# Patient Record
Sex: Female | Born: 1990 | Race: White | Hispanic: No | Marital: Single | Smoking: Former smoker
Health system: Southern US, Community
[De-identification: ages and names within clinical notes are randomized; demographics above are authoritative.]

## PROBLEM LIST (undated history)

## (undated) DIAGNOSIS — F32A Depression, unspecified: Secondary | ICD-10-CM

## (undated) DIAGNOSIS — R319 Hematuria, unspecified: Secondary | ICD-10-CM

## (undated) DIAGNOSIS — F909 Attention-deficit hyperactivity disorder, unspecified type: Secondary | ICD-10-CM

## (undated) DIAGNOSIS — N289 Disorder of kidney and ureter, unspecified: Secondary | ICD-10-CM

## (undated) DIAGNOSIS — F329 Major depressive disorder, single episode, unspecified: Secondary | ICD-10-CM

## (undated) DIAGNOSIS — Z87442 Personal history of urinary calculi: Secondary | ICD-10-CM

## (undated) DIAGNOSIS — N201 Calculus of ureter: Secondary | ICD-10-CM

## (undated) DIAGNOSIS — R350 Frequency of micturition: Secondary | ICD-10-CM

## (undated) DIAGNOSIS — F419 Anxiety disorder, unspecified: Secondary | ICD-10-CM

## (undated) HISTORY — PX: RHINOPLASTY: SUR1284

## (undated) HISTORY — PX: WISDOM TOOTH EXTRACTION: SHX21

---
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
## 2011-03-27 LAB — POCT URINALYSIS DIP (DEVICE)
Bilirubin Urine: NEGATIVE
Glucose, UA: NEGATIVE
Ketones, ur: 40 — AB
Nitrite: NEGATIVE
Operator id: 239701
Protein, ur: 30 — AB
Specific Gravity, Urine: 1.01
Urobilinogen, UA: 0.2
pH: 6

## 2011-03-27 LAB — POCT PREGNANCY, URINE
Operator id: 239701
Preg Test, Ur: NEGATIVE

## 2013-03-14 ENCOUNTER — Encounter (HOSPITAL_BASED_OUTPATIENT_CLINIC_OR_DEPARTMENT_OTHER): Payer: Self-pay | Admitting: *Deleted

## 2013-03-14 ENCOUNTER — Other Ambulatory Visit: Payer: Self-pay | Admitting: Urology

## 2013-03-14 NOTE — Progress Notes (Addendum)
NPO AFTER MN. ARRIVES AT 1100. NEEDS HG, KUB AND  URINE PREG. MAY TAKE OXYCODONE/ PHENERGAN IF NEEDED W/ SIPS OF WATER.

## 2013-03-16 DIAGNOSIS — N201 Calculus of ureter: Secondary | ICD-10-CM | POA: Diagnosis present

## 2013-03-16 NOTE — H&P (Signed)
Reason For Visit  Emily Silva is a 22 year old female who was seen as an emergent work in having experienced right flank pain and gross hematuria.   History of Present Illness  Nephrolithiasis: She has positive multiple stones and has never required any form of surgical intervention. Stone analysis: Calcium oxalate.  Current history: She said she's passed multiple stones over the years but recently, starting on 03/05/13 developed gross hematuria. It was a greater amount of hematuria and she has had with previous stone passage. It was associated with some pain located in the right flank region which is what she experiences typically with her stones. She has been having intermittent gross hematuria and now has been experiencing pain in the lower abdomen and pelvic region that she describes as sharp in nature and intermittent. It's not relieved by positional change.   Past Medical History Problems  1. History of  Anxiety (Symptom) 300.00 2. History of  Nephrolithiasis V13.01  Surgical History Problems  1. History of  Oral Surgery Tooth Extraction  Current Meds 1. Adderall 15 MG Oral Tablet; Therapy: (Recorded:11Sep2014) to 2. Ciprofloxacin HCl 500 MG Oral Tablet; Therapy: (Recorded:11Sep2014) to 3. Flomax 0.4 MG Oral Capsule; Therapy: (Recorded:11Sep2014) to 4. Hydrocodone-Acetaminophen 5-325 MG Oral Tablet; Therapy: (Recorded:11Sep2014) to 5. Phenergan 25 MG TABS; Therapy: (Recorded:11Sep2014) to 6. Xanax 1 MG Oral Tablet; TAKE TABLET  PRN; Therapy: (Recorded:11Sep2014) to  Allergies Medication  1. No Known Drug Allergies  Family History Problems  1. Family history of  Family Health Status Of Father - Alive 2. Family history of  Family Health Status Of Mother - Alive 3. Paternal history of  Hematuria 4. Family history of  Nephrolithiasis 5. Paternal uncle's history of  Prostate Cancer V16.42  Social History Problems    Alcohol Use 1-2 per month   Caffeine Use 2   Currently In  School   Marital History - Single   Tobacco Use 305.1 smokes 1 pack occasionally/socailly for 1 year  Review of Systems Genitourinary, constitutional, skin, eye, otolaryngeal, hematologic/lymphatic, cardiovascular, pulmonary, endocrine, musculoskeletal, gastrointestinal, neurological and psychiatric system(s) were reviewed and pertinent findings if present are noted.  Genitourinary: urinary frequency, urinary urgency, dysuria, nocturia, urinary hesitancy, urinary stream starts and stops, hematuria and initiating urination requires straining.  Gastrointestinal: nausea and vomiting.  Constitutional: night sweats and feeling tired (fatigue).  ENT: sinus problems.  Musculoskeletal: back pain.    Vitals Vital Signs  BMI Calculated: 26.04 BSA Calculated: 2.04 Height: 5 ft 11 in Weight: 186 lb  Blood Pressure: 133 / 90 Temperature: 98.4 F Heart Rate: 86  Physical Exam Constitutional: Well nourished and well developed . No acute distress.  ENT:. The ears and nose are normal in appearance.  Neck: The appearance of the neck is normal and no neck mass is present.  Pulmonary: No respiratory distress and normal respiratory rhythm and effort.  Cardiovascular: Heart rate and rhythm are normal . No peripheral edema.  Abdomen: The abdomen is soft and nontender. No masses are palpated. No CVA tenderness. No hernias are palpable. No hepatosplenomegaly noted.  Lymphatics: The femoral and inguinal nodes are not enlarged or tender.  Skin: Normal skin turgor, no visible rash and no visible skin lesions.  Neuro/Psych:. Mood and affect are appropriate.    Results/Data Urine  COLOR YELLOW  APPEARANCE CLEAR  SPECIFIC GRAVITY 1.025  pH 6.0  GLUCOSE NEG mg/dL BILIRUBIN NEG  KETONE NEG mg/dL BLOOD LARGE  PROTEIN NEG mg/dL UROBILINOGEN 0.2 mg/dL NITRITE NEG  LEUKOCYTE ESTERASE NEG  SQUAMOUS EPITHELIAL/HPF  FEW  WBC 3-6 WBC/hpf RBC 11-20 RBC/hpf BACTERIA FEW  CRYSTALS NONE SEEN  CASTS NONE SEEN   Other MUCUS NOTED   The following images/tracing/specimen were independently visualized:  KUB as below.  The following clinical lab reports were reviewed:  Her urine had hematuria but did not appear to be infected.    Assessment Assessed  1. Gross Hematuria 599.71 2. History of  Nephrolithiasis V13.01 3. Proximal Ureteral Stone On The Right 592.1   I obtained a KUB. It reveals a calcification located at the ureteropelvic junction on the right-hand side that measures 5 mm. She told me that the stones she's had in the past were so small they were difficult to seen on plain x-ray and typically requires CT scan. She said she's never passed anything of that size. They're usually only a couple of millimeters in size. She has been having a lot of pain. We discussed giving her a shot of medication for pain as well and has an antiemetic and she has had some nausea. We then discussed the treatment options.  I went over her lithotripsy and ureteroscopy with her in detail. We discussed each of these procedures including there probabilities of success and the outpatient nature of the procedure. She understands that she would almost certainly require a stent after ureteroscopy. We also discussed the anticipated postoperative course of both these procedures. After answering all of her questions to her satisfaction she has elected to proceed with ureteroscopic management of her stone. She wanted to have the procedure the gave her the greatest probability of being stone free.   Plan  1. Phenergan and Toradol were administered in the office today. 2. Prescription for Zofran, oxycodone and tamsulosin he is a were given. 3. She also received a shot of Demerol as she was still having some pain.  4. She will be scheduled for right ureteroscopy laser lithotripsy of a proximal ureteral stone.

## 2013-03-17 ENCOUNTER — Ambulatory Visit (HOSPITAL_COMMUNITY): Payer: Managed Care, Other (non HMO)

## 2013-03-17 ENCOUNTER — Encounter (HOSPITAL_BASED_OUTPATIENT_CLINIC_OR_DEPARTMENT_OTHER)
Admission: RE | Disposition: A | Discharge status: Home / Self Care | Payer: Self-pay | Source: Ambulatory Visit | Attending: Urology

## 2013-03-17 ENCOUNTER — Encounter (HOSPITAL_BASED_OUTPATIENT_CLINIC_OR_DEPARTMENT_OTHER): Payer: Self-pay | Admitting: *Deleted

## 2013-03-17 ENCOUNTER — Ambulatory Visit (HOSPITAL_BASED_OUTPATIENT_CLINIC_OR_DEPARTMENT_OTHER)
Admission: RE | Admit: 2013-03-17 | Discharge: 2013-03-17 | Disposition: A | Discharge status: Home / Self Care | Payer: Managed Care, Other (non HMO) | Source: Ambulatory Visit | Attending: Urology | Admitting: Urology

## 2013-03-17 ENCOUNTER — Encounter (HOSPITAL_BASED_OUTPATIENT_CLINIC_OR_DEPARTMENT_OTHER): Payer: Self-pay | Admitting: Anesthesiology

## 2013-03-17 ENCOUNTER — Ambulatory Visit (HOSPITAL_BASED_OUTPATIENT_CLINIC_OR_DEPARTMENT_OTHER): Payer: Managed Care, Other (non HMO) | Admitting: Anesthesiology

## 2013-03-17 DIAGNOSIS — R310 Gross hematuria: Secondary | ICD-10-CM | POA: Insufficient documentation

## 2013-03-17 DIAGNOSIS — N201 Calculus of ureter: Secondary | ICD-10-CM | POA: Insufficient documentation

## 2013-03-17 DIAGNOSIS — F411 Generalized anxiety disorder: Secondary | ICD-10-CM | POA: Insufficient documentation

## 2013-03-17 HISTORY — DX: Anxiety disorder, unspecified: F41.9

## 2013-03-17 HISTORY — DX: Personal history of urinary calculi: Z87.442

## 2013-03-17 HISTORY — DX: Attention-deficit hyperactivity disorder, unspecified type: F90.9

## 2013-03-17 HISTORY — DX: Calculus of ureter: N20.1

## 2013-03-17 HISTORY — DX: Hematuria, unspecified: R31.9

## 2013-03-17 HISTORY — DX: Frequency of micturition: R35.0

## 2013-03-17 HISTORY — PX: CYSTOSCOPY WITH URETEROSCOPY AND STENT PLACEMENT: SHX6377

## 2013-03-17 LAB — POCT PREGNANCY, URINE: Preg Test, Ur: NEGATIVE

## 2013-03-17 LAB — POCT HEMOGLOBIN-HEMACUE: Hemoglobin: 12.6 g/dL (ref 12.0–15.0)

## 2013-03-17 SURGERY — CYSTOURETEROSCOPY, WITH STENT INSERTION
Anesthesia: General | Site: Ureter | Laterality: Right | Wound class: Clean Contaminated

## 2013-03-17 MED ORDER — OXYCODONE HCL 5 MG PO TABS
5.0000 mg | ORAL_TABLET | Freq: Once | ORAL | Status: DC | PRN
  Filled 2013-03-17: qty 1

## 2013-03-17 MED ORDER — MIDAZOLAM HCL 5 MG/5ML IJ SOLN
INTRAMUSCULAR | Status: DC | PRN
  Administered 2013-03-17: 2 mg via INTRAVENOUS

## 2013-03-17 MED ORDER — LACTATED RINGERS IV SOLN
INTRAVENOUS | Status: DC
  Administered 2013-03-17 (×2): via INTRAVENOUS
  Filled 2013-03-17: qty 1000

## 2013-03-17 MED ORDER — BELLADONNA ALKALOIDS-OPIUM 16.2-60 MG RE SUPP
RECTAL | Status: DC | PRN
  Administered 2013-03-17: 1 via RECTAL

## 2013-03-17 MED ORDER — PROPOFOL 10 MG/ML IV BOLUS
INTRAVENOUS | Status: DC | PRN
  Administered 2013-03-17: 200 mg via INTRAVENOUS

## 2013-03-17 MED ORDER — OXYCODONE HCL 5 MG PO TABS
5.0000 mg | ORAL_TABLET | ORAL | Status: DC | PRN
  Administered 2013-03-17: 5 mg via ORAL
  Filled 2013-03-17: qty 1

## 2013-03-17 MED ORDER — OXYCODONE HCL 5 MG/5ML PO SOLN
5.0000 mg | Freq: Once | ORAL | Status: DC | PRN
  Filled 2013-03-17: qty 5

## 2013-03-17 MED ORDER — PHENAZOPYRIDINE HCL 200 MG PO TABS
200.0000 mg | ORAL_TABLET | Freq: Three times a day (TID) | ORAL | Status: DC
  Administered 2013-03-17: 200 mg via ORAL
  Filled 2013-03-17: qty 1

## 2013-03-17 MED ORDER — DEXAMETHASONE SODIUM PHOSPHATE 4 MG/ML IJ SOLN
INTRAMUSCULAR | Status: DC | PRN
  Administered 2013-03-17: 10 mg via INTRAVENOUS

## 2013-03-17 MED ORDER — PHENAZOPYRIDINE HCL 200 MG PO TABS: 200.0000 mg | ORAL_TABLET | Freq: Three times a day (TID) | ORAL | Status: DC | PRN

## 2013-03-17 MED ORDER — SODIUM CHLORIDE 0.9 % IR SOLN
Status: DC | PRN
  Administered 2013-03-17: 6000 mL

## 2013-03-17 MED ORDER — CIPROFLOXACIN IN D5W 200 MG/100ML IV SOLN
200.0000 mg | INTRAVENOUS | Status: AC
  Administered 2013-03-17: 200 mg via INTRAVENOUS
  Filled 2013-03-17: qty 100

## 2013-03-17 MED ORDER — IOHEXOL 350 MG/ML SOLN
INTRAVENOUS | Status: DC | PRN
  Administered 2013-03-17: 10 mL via INTRAVENOUS

## 2013-03-17 MED ORDER — MEPERIDINE HCL 25 MG/ML IJ SOLN
6.2500 mg | INTRAMUSCULAR | Status: DC | PRN
  Filled 2013-03-17: qty 1

## 2013-03-17 MED ORDER — OXYCODONE-ACETAMINOPHEN 10-325 MG PO TABS: 1.0000 | ORAL_TABLET | ORAL | Status: DC | PRN

## 2013-03-17 MED ORDER — OXYCODONE-ACETAMINOPHEN 5-325 MG PO TABS
1.0000 | ORAL_TABLET | ORAL | Status: DC | PRN
  Administered 2013-03-17: 1 via ORAL
  Filled 2013-03-17: qty 1

## 2013-03-17 MED ORDER — LIDOCAINE HCL (CARDIAC) 20 MG/ML IV SOLN
INTRAVENOUS | Status: DC | PRN
  Administered 2013-03-17: 100 mg via INTRAVENOUS

## 2013-03-17 MED ORDER — PROMETHAZINE HCL 25 MG/ML IJ SOLN
6.2500 mg | INTRAMUSCULAR | Status: DC | PRN
  Filled 2013-03-17: qty 1

## 2013-03-17 MED ORDER — KETOROLAC TROMETHAMINE 30 MG/ML IJ SOLN
INTRAMUSCULAR | Status: DC | PRN
  Administered 2013-03-17: 30 mg via INTRAVENOUS

## 2013-03-17 MED ORDER — FENTANYL CITRATE 0.05 MG/ML IJ SOLN
INTRAMUSCULAR | Status: DC | PRN
  Administered 2013-03-17 (×4): 50 ug via INTRAVENOUS

## 2013-03-17 MED ORDER — ONDANSETRON HCL 4 MG/2ML IJ SOLN
INTRAMUSCULAR | Status: DC | PRN
  Administered 2013-03-17: 4 mg via INTRAVENOUS

## 2013-03-17 MED ORDER — METOCLOPRAMIDE HCL 5 MG/ML IJ SOLN
INTRAMUSCULAR | Status: DC | PRN
  Administered 2013-03-17: 10 mg via INTRAVENOUS

## 2013-03-17 MED ORDER — HYDROMORPHONE HCL PF 1 MG/ML IJ SOLN
0.2500 mg | INTRAMUSCULAR | Status: DC | PRN
  Administered 2013-03-17: 0.5 mg via INTRAVENOUS
  Filled 2013-03-17: qty 1

## 2013-03-17 SURGICAL SUPPLY — 37 items
ADAPTER CATH URET PLST 4-6FR (CATHETERS) IMPLANT
ADPR CATH URET STRL DISP 4-6FR (CATHETERS)
BAG DRAIN URO-CYSTO SKYTR STRL (DRAIN) ×2 IMPLANT
BAG DRN UROCATH (DRAIN) ×1
BASKET LASER NITINOL 1.9FR (BASKET) ×1 IMPLANT
BASKET STNLS GEMINI 4WIRE 3FR (BASKET) ×1 IMPLANT
BASKET ZERO TIP NITINOL 2.4FR (BASKET) IMPLANT
BRUSH URET BIOPSY 3F (UROLOGICAL SUPPLIES) IMPLANT
BSKT STON RTRVL 120 1.9FR (BASKET) ×1
BSKT STON RTRVL GEM 120X11 3FR (BASKET) ×1
BSKT STON RTRVL ZERO TP 2.4FR (BASKET)
CANISTER SUCT LVC 12 LTR MEDI- (MISCELLANEOUS) ×1 IMPLANT
CATH INTERMIT  6FR 70CM (CATHETERS) ×1 IMPLANT
CATH URET 5FR 28IN CONE TIP (BALLOONS)
CATH URET 5FR 70CM CONE TIP (BALLOONS) IMPLANT
CLOTH BEACON ORANGE TIMEOUT ST (SAFETY) ×2 IMPLANT
DRAPE CAMERA CLOSED 9X96 (DRAPES) ×2 IMPLANT
ELECT REM PT RETURN 9FT ADLT (ELECTROSURGICAL)
ELECTRODE REM PT RTRN 9FT ADLT (ELECTROSURGICAL) IMPLANT
GLOVE BIO SURGEON STRL SZ8 (GLOVE) ×2 IMPLANT
GLOVE BIOGEL M 6.5 STRL (GLOVE) ×1 IMPLANT
GOWN PREVENTION PLUS LG XLONG (DISPOSABLE) ×2 IMPLANT
GOWN STRL REIN XL XLG (GOWN DISPOSABLE) ×2 IMPLANT
GUIDEWIRE 0.038 PTFE COATED (WIRE) IMPLANT
GUIDEWIRE ANG ZIPWIRE 038X150 (WIRE) IMPLANT
GUIDEWIRE STR DUAL SENSOR (WIRE) ×2 IMPLANT
IV NS IRRIG 3000ML ARTHROMATIC (IV SOLUTION) ×4 IMPLANT
KIT BALLIN UROMAX 15FX10 (LABEL) IMPLANT
KIT BALLN UROMAX 15FX4 (MISCELLANEOUS) IMPLANT
KIT BALLN UROMAX 26 75X4 (MISCELLANEOUS)
LASER FIBER DISP (UROLOGICAL SUPPLIES) ×1 IMPLANT
PACK CYSTOSCOPY (CUSTOM PROCEDURE TRAY) ×2 IMPLANT
SET HIGH PRES BAL DIL (LABEL)
SHEATH ACCESS URETERAL 38CM (SHEATH) ×1 IMPLANT
SHEATH ACCESS URETERAL 54CM (SHEATH) IMPLANT
STENT PERCUFLEX 4.8FRX24 (STENTS) ×1 IMPLANT
WATER STERILE IRR 3000ML UROMA (IV SOLUTION) IMPLANT

## 2013-03-17 NOTE — Op Note (Signed)
PATIENT:  Emily Silva  PRE-OPERATIVE DIAGNOSIS:  right Ureteral calculus  POST-OPERATIVE DIAGNOSIS: Same  PROCEDURE:  1. Cystoscopy with right retrograde pyelogram including interpretation. 2. Right ureteroscopy and laser lithotripsy. 3. Right ureteral stone extraction. 4. Right double-J stent placement  SURGEON: Garnett Farm, MD  INDICATION: Emily Silva is a 22 year old female with a history of kidney stones he developed gross hematuria and was found to have a 5 mm stone located at the ureteropelvic junction. She had not passed any stones that size before. We therefore discussed the treatment options and she has elected to proceed with ureteroscopic management.  ANESTHESIA:  General  EBL:  Minimal  DRAINS: 4.8 French, 24 cm double-J stent in the right ureter (with string)  SPECIMEN:  Stone  DISPOSITION OF SPECIMEN:  Taken to my office to be sent for composition analysis.  DESCRIPTION OF PROCEDURE: The patient was taken to the major OR and placed on the table. General anesthesia was administered and then the patient was moved to the dorsal lithotomy position. The genitalia was sterilely prepped and draped. An official timeout was performed.  Initially the 22 French cystoscope with 12 lens was passed under direct vision. The bladder was then entered and fully inspected. It was noted be free of any tumors stones or inflammatory lesions. Ureteral orifices were of normal configuration and position. A 6 French open-ended ureteral catheter was then passed through the cystoscope into the ureteral orifice in order to perform a right retrograde pyelogram.  A retrograde pyelogram was performed by injecting full-strength contrast up the right ureter under direct fluoroscopic control. It revealed a filling defect in the proximal lureter consistent with the stone seen on the preoperative KUB. The remainder of the ureter was noted to be normal as was the intrarenal collecting system. I then passed a  0.038 inch floppy-tipped guidewire through the open ended catheter and into the area of the renal pelvis and this was left in place. The inner portion of a ureteral access sheath was then passed over the guidewire to gently dilate the intramural ureter. I then proceeded with ureteroscopy.  A 6 French flexible, digital ureteroscope was then passed under direct into the bladder over the guidewire and up the ureter under fluoroscopy. The stone was identified and I felt it was too large to extract and therefore elected to proceed with laser lithotripsy. I first engaged the stone and an escape basket. The 200  holmium laser fiber was used to fragment the stone. I then used the nitinol basket to extract all of the stone fragments and reinspection of the ureter ureteroscopically revealed no further stone fragments and no injury to the ureter. I then backloaded the cystoscope over the guidewire and passed the stent over the guidewire into the area of the renal pelvis. As the guidewire was removed good curl was noted in the renal pelvis. The bladder was drained and the cystoscope was then removed. The patient tolerated the procedure well no intraoperative complications.  PLAN OF CARE: Discharge to home after PACU  PATIENT DISPOSITION:  PACU - hemodynamically stable.

## 2013-03-17 NOTE — Interval H&P Note (Signed)
History and Physical Interval Note:  03/17/2013 12:34 PM  Emily Silva  has presented today for surgery, with the diagnosis of RIGHT UPJ STONE  The various methods of treatment have been discussed with the patient and family. After consideration of risks, benefits and other options for treatment, the patient has consented to  Procedure(s): CYSTOSCOPY WITH RIGHT URETEROSCOPY LASER LITHOTRIPSY AND STENT PLACEMENT (Right) as a surgical intervention .  The patient's history has been reviewed, patient examined, no change in status, stable for surgery.  I have reviewed the patient's chart and labs.  Questions were answered to the patient's satisfaction.     Garnett Farm

## 2013-03-17 NOTE — Transfer of Care (Signed)
Immediate Anesthesia Transfer of Care Note  Patient: Emily Silva  Procedure(s) Performed: Procedure(s) (LRB): CYSTOSCOPY WITH RIGHT URETEROSCOPY LASER LITHOTRIPSY AND STENT PLACEMENT (Right)  Patient Location: PACU  Anesthesia Type: General  Level of Consciousness: awake, alert  and oriented  Airway & Oxygen Therapy: Patient Spontanous Breathing and Patient connected to face mask oxygen  Post-op Assessment: Report given to PACU RN and Post -op Vital signs reviewed and stable  Post vital signs: Reviewed and stable  Complications: No apparent anesthesia complications

## 2013-03-17 NOTE — Anesthesia Procedure Notes (Signed)
Procedure Name: LMA Insertion Date/Time: 03/17/2013 12:46 PM Performed by: Norva Pavlov Pre-anesthesia Checklist: Patient identified, Emergency Drugs available, Suction available and Patient being monitored Patient Re-evaluated:Patient Re-evaluated prior to inductionOxygen Delivery Method: Circle System Utilized Preoxygenation: Pre-oxygenation with 100% oxygen Intubation Type: IV induction Ventilation: Mask ventilation without difficulty LMA: LMA inserted LMA Size: 4.0 Number of attempts: 1 Airway Equipment and Method: bite block Placement Confirmation: positive ETCO2 Tube secured with: Tape Dental Injury: Teeth and Oropharynx as per pre-operative assessment

## 2013-03-17 NOTE — Anesthesia Preprocedure Evaluation (Signed)
Anesthesia Evaluation  Patient identified by MRN, date of birth, ID band Patient awake    Reviewed: Allergy & Precautions, H&P , NPO status , Patient's Chart, lab work & pertinent test results  Airway       Dental  (+) Dental Advisory Given   Pulmonary neg pulmonary ROS,          Cardiovascular negative cardio ROS      Neuro/Psych PSYCHIATRIC DISORDERS negative neurological ROS     GI/Hepatic negative GI ROS, Neg liver ROS,   Endo/Other  negative endocrine ROS  Renal/GU negative Renal ROS     Musculoskeletal negative musculoskeletal ROS (+)   Abdominal   Peds  Hematology negative hematology ROS (+)   Anesthesia Other Findings   Reproductive/Obstetrics negative OB ROS                           Anesthesia Physical Anesthesia Plan  ASA: I  Anesthesia Plan: General   Post-op Pain Management:    Induction: Intravenous  Airway Management Planned: LMA  Additional Equipment:   Intra-op Plan:   Post-operative Plan: Extubation in OR  Informed Consent: I have reviewed the patients History and Physical, chart, labs and discussed the procedure including the risks, benefits and alternatives for the proposed anesthesia with the patient or authorized representative who has indicated his/her understanding and acceptance.   Dental advisory given  Plan Discussed with: CRNA  Anesthesia Plan Comments:         Anesthesia Quick Evaluation

## 2013-03-18 ENCOUNTER — Encounter (HOSPITAL_BASED_OUTPATIENT_CLINIC_OR_DEPARTMENT_OTHER): Payer: Self-pay | Admitting: Urology

## 2013-03-18 NOTE — Anesthesia Postprocedure Evaluation (Signed)
Anesthesia Post Note  Patient: Emily Silva  Procedure(s) Performed: Procedure(s) (LRB): CYSTOSCOPY WITH RIGHT URETEROSCOPY HOLMIUM LASER OF STONESER LITHOTRIPSY AND STENT PLACEMENT (Right)  Anesthesia type: General  Patient location: PACU  Post pain: Pain level controlled  Post assessment: Post-op Vital signs reviewed  Last Vitals: BP 131/92  Pulse 89  Temp(Src) 36.8 C (Oral)  Resp 14  Ht 5\' 11"  (1.803 m)  Wt 192 lb (87.091 kg)  BMI 26.79 kg/m2  SpO2 99%  LMP 02/26/2013  Post vital signs: Reviewed  Level of consciousness: sedated  Complications: No apparent anesthesia complications

## 2013-06-22 ENCOUNTER — Ambulatory Visit (INDEPENDENT_AMBULATORY_CARE_PROVIDER_SITE_OTHER): Payer: Managed Care, Other (non HMO) | Admitting: Family Medicine

## 2013-06-22 VITALS — BP 120/88 | HR 82 | Temp 98.0°F | Resp 16 | Ht 71.0 in | Wt 191.0 lb

## 2013-06-22 DIAGNOSIS — J029 Acute pharyngitis, unspecified: Secondary | ICD-10-CM

## 2013-06-22 DIAGNOSIS — R05 Cough: Secondary | ICD-10-CM

## 2013-06-22 DIAGNOSIS — R059 Cough, unspecified: Secondary | ICD-10-CM

## 2013-06-22 DIAGNOSIS — R5381 Other malaise: Secondary | ICD-10-CM

## 2013-06-22 DIAGNOSIS — R5383 Other fatigue: Secondary | ICD-10-CM

## 2013-06-22 LAB — POCT RAPID STREP A (OFFICE): Rapid Strep A Screen: NEGATIVE

## 2013-06-22 MED ORDER — HYDROCOD POLST-CHLORPHEN POLST 10-8 MG/5ML PO LQCR: 5.0000 mL | Freq: Two times a day (BID) | ORAL | Status: DC | PRN

## 2013-06-22 MED ORDER — AZITHROMYCIN 250 MG PO TABS: ORAL_TABLET | ORAL | Status: DC

## 2013-06-22 NOTE — Patient Instructions (Signed)

## 2013-06-22 NOTE — Progress Notes (Signed)
Patient ID: Emily Silva MRN: [id number], DOB: 01-10-1991, 22 y.o. Date of Encounter: 06/22/2013, 6:07 PM  Primary Physician: Boneta Lucks, NP  Chief Complaint:  Chief Complaint  Patient presents with  . Sore Throat    x 5 day    HPI: 22 y.o. year old female presents with 5 day history of sore throat. Subjective fever and chills. No cough, congestion, rhinorrhea, sinus pressure, otalgia, or headache. Normal hearing. No GI complaints. Able to swallow saliva, but hurts to do so. Decreased appetite secondary to sore throat.   Coughing quite a bit as well  Past Medical History  Diagnosis Date  . ADHD (attention deficit hyperactivity disorder)   . Anxiety disorder   . Right ureteral stone   . Hematuria   . Frequency of urination   . History of kidney stones      Home Meds: Prior to Admission medications   Medication Sig Start Date End Date Taking? Authorizing Provider  ALPRAZolam Prudy Feeler) 1 MG tablet Take 1-4 mg by mouth as directed. MAY TAKE 1 TO 4 TAB PER DAY--  PT STATES MOSTLY TAKES BID   Yes Historical Provider, MD  amphetamine-dextroamphetamine (ADDERALL) 15 MG tablet Take 15 mg by mouth daily. TAKES ONLY SCHOOL DAYS   Yes Historical Provider, MD  valACYclovir (VALTREX) 1000 MG tablet Take 1,000 mg by mouth as needed.   Yes Historical Provider, MD  oxyCODONE (OXYCONTIN) 10 MG 12 hr tablet Take 10-20 mg by mouth every 12 (twelve) hours.    Historical Provider, MD  oxyCODONE-acetaminophen (PERCOCET) 10-325 MG per tablet Take 1 tablet by mouth every 4 (four) hours as needed for pain. 03/17/13   Garnett Farm, MD  phenazopyridine (PYRIDIUM) 200 MG tablet Take 1 tablet (200 mg total) by mouth 3 (three) times daily as needed for pain. 03/17/13   Garnett Farm, MD  promethazine (PHENERGAN) 25 MG tablet Take 25 mg by mouth every 6 (six) hours as needed for nausea.    Historical Provider, MD  tamsulosin (FLOMAX) 0.4 MG CAPS capsule Take by mouth.    Historical Provider, MD      Allergies:  Allergies  Allergen Reactions  . Nickel Rash    History   Social History  . Marital Status: Single    Spouse Name: N/A    Number of Children: N/A  . Years of Education: N/A   Occupational History  . Not on file.   Social History Main Topics  . Smoking status: Current Some Day Smoker    Types: Cigarettes  . Smokeless tobacco: Never Used     Comment: occasional smoker of cigarettes and e-cig since 2012  . Alcohol Use: Yes     Comment: occasional  . Drug Use: No  . Sexual Activity: Not on file   Other Topics Concern  . Not on file   Social History Narrative  . No narrative on file     Review of Systems: Constitutional: negative for chills, fever, night sweats or weight changes HEENT: see above Cardiovascular: negative for chest pain or palpitations Respiratory: negative for hemoptysis, wheezing, or shortness of breath Abdominal: negative for abdominal pain, nausea, vomiting or diarrhea Dermatological: negative for rash Neurologic: negative for headache   Physical Exam: Blood pressure 120/88, pulse 82, temperature 98 F (36.7 C), temperature source Oral, resp. rate 16, height 5\' 11"  (1.803 m), weight 191 lb (86.637 kg), last menstrual period 06/01/2013, SpO2 98.00%., Body mass index is 26.65 kg/(m^2). General: Well developed, well  nourished, in no acute distress. Head: Normocephalic, atraumatic, eyes without discharge, sclera non-icteric, nares are patent. Bilateral auditory canals clear, TM's are without perforation, pearly grey with reflective cone of light bilaterally. No sinus TTP. Oral cavity moist, dentition normal. Posterior pharynx with post nasal drip and mild erythema. No peritonsillar abscess or tonsillar exudate. Neck: Supple. No thyromegaly. Full ROM. No lymphadenopathy. Lungs: Clear bilaterally to auscultation without wheezes, rales, or rhonchi. Breathing is unlabored. Heart: RRR with S1 S2. No murmurs, rubs, or gallops  appreciated. Abdomen: Soft, non-tender, non-distended with normoactive bowel sounds. No hepatomegaly. No rebound/guarding. No obvious abdominal masses. Msk:  Strength and tone normal for age. Extremities: No clubbing or cyanosis. No edema. Neuro: Alert and oriented X 3. Moves all extremities spontaneously. CNII-XII grossly in tact. Psych:  Responds to questions appropriately with a normal affect.   Labs: Results for orders placed in visit on 06/22/13  POCT RAPID STREP A (OFFICE)      Result Value Range   Rapid Strep A Screen Negative  Negative    ASSESSMENT AND PLAN:  22 y.o. year old female with  - -Tylenol/Motrin prn -Rest/fluids -RTC precautions -RTC 3-5 days if no improvement  Signed, Elvina Sidle, MD 06/22/2013 6:07 PM

## 2013-09-11 ENCOUNTER — Ambulatory Visit (INDEPENDENT_AMBULATORY_CARE_PROVIDER_SITE_OTHER): Payer: Managed Care, Other (non HMO) | Admitting: Family Medicine

## 2013-09-11 VITALS — BP 136/84 | HR 134 | Temp 98.4°F | Resp 16 | Ht 71.0 in | Wt 199.8 lb

## 2013-09-11 DIAGNOSIS — R109 Unspecified abdominal pain: Secondary | ICD-10-CM

## 2013-09-11 DIAGNOSIS — R8281 Pyuria: Secondary | ICD-10-CM

## 2013-09-11 DIAGNOSIS — R82998 Other abnormal findings in urine: Secondary | ICD-10-CM

## 2013-09-11 DIAGNOSIS — R110 Nausea: Secondary | ICD-10-CM

## 2013-09-11 LAB — POCT URINALYSIS DIPSTICK
Bilirubin, UA: NEGATIVE
Glucose, UA: NEGATIVE
Ketones, UA: NEGATIVE
Nitrite, UA: NEGATIVE
Spec Grav, UA: 1.01
Urobilinogen, UA: 0.2
pH, UA: 6.5

## 2013-09-11 LAB — POCT UA - MICROSCOPIC ONLY
Casts, Ur, LPF, POC: NEGATIVE
Crystals, Ur, HPF, POC: NEGATIVE
Mucus, UA: NEGATIVE
Yeast, UA: NEGATIVE

## 2013-09-11 MED ORDER — ONDANSETRON HCL 4 MG PO TABS
8.0000 mg | ORAL_TABLET | Freq: Once | ORAL | Status: AC
  Administered 2013-09-11: 8 mg via ORAL

## 2013-09-11 MED ORDER — NALBUPHINE HCL 10 MG/ML IJ SOLN: 10.0000 mg | INTRAMUSCULAR | Status: DC | PRN

## 2013-09-11 MED ORDER — NALBUPHINE HCL 10 MG/ML IJ SOLN
10.0000 mg | Freq: Once | INTRAMUSCULAR | Status: AC
  Administered 2013-09-11: 10 mg via INTRAMUSCULAR

## 2013-09-11 MED ORDER — ONDANSETRON HCL 4 MG PO TABS: 4.0000 mg | ORAL_TABLET | Freq: Three times a day (TID) | ORAL | Status: DC | PRN

## 2013-09-11 MED ORDER — KETOROLAC TROMETHAMINE 10 MG PO TABS: 10.0000 mg | ORAL_TABLET | Freq: Four times a day (QID) | ORAL | Status: DC | PRN

## 2013-09-11 MED ORDER — OXYCODONE-ACETAMINOPHEN 10-325 MG PO TABS: 1.0000 | ORAL_TABLET | ORAL | Status: DC | PRN

## 2013-09-11 MED ORDER — KETOROLAC TROMETHAMINE 60 MG/2ML IM SOLN: 60.0000 mg | Freq: Once | INTRAMUSCULAR | Status: DC

## 2013-09-11 MED ORDER — CIPROFLOXACIN HCL 500 MG PO TABS: 500.0000 mg | ORAL_TABLET | Freq: Two times a day (BID) | ORAL | Status: DC

## 2013-09-11 NOTE — Progress Notes (Signed)
23 yo woman with recurrent kidney stones, last one couple months ago.  Not on a special diet, but is planning on 24 hour urine test to see if diet would help.  Had stone basket procedure in September.  Patient has had two days of intermittent right flank pain.  Notes associated right back pain.  Increased frequency.  Sees urologist here in town.  Objective:  NAD  Mild right flank pain  Results for orders placed in visit on 09/11/13  POCT URINALYSIS DIPSTICK      Result Value Ref Range   Color, UA yellow     Clarity, UA hazy     Glucose, UA neg     Bilirubin, UA neg     Ketones, UA neg     Spec Grav, UA 1.010     Blood, UA small     pH, UA 6.5     Protein, UA small     Urobilinogen, UA 0.2     Nitrite, UA neg     Leukocytes, UA small (1+)    POCT UA - MICROSCOPIC ONLY      Result Value Ref Range   WBC, Ur, HPF, POC 5-8     RBC, urine, microscopic 2-4     Bacteria, U Microscopic 1+     Mucus, UA neg     Epithelial cells, urine per micros 2-4     Crystals, Ur, HPF, POC neg     Casts, Ur, LPF, POC neg     Yeast, UA neg     Right flank pain - Plan: POCT urinalysis dipstick, POCT UA - Microscopic Only, Urine culture, nalbuphine (NUBAIN) injection 10 mg, DISCONTINUED: ketorolac (TORADOL) injection 60 mg  Nausea alone - Plan: ondansetron (ZOFRAN) tablet 8 mg, ondansetron (ZOFRAN) 4 MG tablet  Pyuria - Plan: ciprofloxacin (CIPRO) 500 MG tablet  Signed, Elvina SidleKurt Jonisha Kindig, MD

## 2013-09-11 NOTE — Patient Instructions (Signed)
Urinary Tract Infection  Urinary tract infections (UTIs) can develop anywhere along your urinary tract. Your urinary tract is your body's drainage system for removing wastes and extra water. Your urinary tract includes two kidneys, two ureters, a bladder, and a urethra. Your kidneys are a pair of bean-shaped organs. Each kidney is about the size of your fist. They are located below your ribs, one on each side of your spine.  CAUSES  Infections are caused by microbes, which are microscopic organisms, including fungi, viruses, and bacteria. These organisms are so small that they can only be seen through a microscope. Bacteria are the microbes that most commonly cause UTIs.  SYMPTOMS   Symptoms of UTIs may vary by age and gender of the patient and by the location of the infection. Symptoms in young women typically include a frequent and intense urge to urinate and a painful, burning feeling in the bladder or urethra during urination. Older women and men are more likely to be tired, shaky, and weak and have muscle aches and abdominal pain. A fever may mean the infection is in your kidneys. Other symptoms of a kidney infection include pain in your back or sides below the ribs, nausea, and vomiting.  DIAGNOSIS  To diagnose a UTI, your caregiver will ask you about your symptoms. Your caregiver also will ask to provide a urine sample. The urine sample will be tested for bacteria and white blood cells. White blood cells are made by your body to help fight infection.  TREATMENT   Typically, UTIs can be treated with medication. Because most UTIs are caused by a bacterial infection, they usually can be treated with the use of antibiotics. The choice of antibiotic and length of treatment depend on your symptoms and the type of bacteria causing your infection.  HOME CARE INSTRUCTIONS   If you were prescribed antibiotics, take them exactly as your caregiver instructs you. Finish the medication even if you feel better after you  have only taken some of the medication.   Drink enough water and fluids to keep your urine clear or pale yellow.   Avoid caffeine, tea, and carbonated beverages. They tend to irritate your bladder.   Empty your bladder often. Avoid holding urine for long periods of time.   Empty your bladder before and after sexual intercourse.   After a bowel movement, women should cleanse from front to back. Use each tissue only once.  SEEK MEDICAL CARE IF:    You have back pain.   You develop a fever.   Your symptoms do not begin to resolve within 3 days.  SEEK IMMEDIATE MEDICAL CARE IF:    You have severe back pain or lower abdominal pain.   You develop chills.   You have nausea or vomiting.   You have continued burning or discomfort with urination.  MAKE SURE YOU:    Understand these instructions.   Will watch your condition.   Will get help right away if you are not doing well or get worse.  Document Released: 03/29/2005 Document Revised: 12/19/2011 Document Reviewed: 07/28/2011  [employer] Patient Information 2014 [employer]

## 2013-09-13 LAB — URINE CULTURE: Colony Count: 6000

## 2013-09-14 ENCOUNTER — Encounter: Payer: Self-pay | Admitting: Urology

## 2013-09-18 ENCOUNTER — Telehealth: Payer: Self-pay | Admitting: *Deleted

## 2013-09-18 NOTE — Telephone Encounter (Signed)
Pt called, she needs a refill of pain medication until she sees her other doctor on Monday for her kidney stones. She saw Dr. Milus GlazierLauenstein on 3/12 and she is now out of medication.  She is passing blood and in pain. Please call [phone]

## 2013-09-19 NOTE — Telephone Encounter (Signed)
Pt rtn call- She is having more of the kidney pain now. The infection seems to have cleared. There has been an increase of blood she has noticed. No burning with urination. Tordol does not seem to help as well as the Percocet medication. She is almost out of the percocet. Please advise if we can give her a small refill until her appt Monday.  She sees her Urinologist on Monday.

## 2013-09-19 NOTE — Telephone Encounter (Signed)
We cannot refill the percocet.

## 2013-09-19 NOTE — Telephone Encounter (Signed)
LM for rtn call- Pt needs to RTC

## 2014-02-16 ENCOUNTER — Ambulatory Visit (INDEPENDENT_AMBULATORY_CARE_PROVIDER_SITE_OTHER): Payer: Managed Care, Other (non HMO) | Admitting: Internal Medicine

## 2014-02-16 VITALS — BP 118/86 | HR 79 | Temp 99.0°F | Resp 16 | Ht 70.0 in | Wt 203.0 lb

## 2014-02-16 DIAGNOSIS — A6009 Herpesviral infection of other urogenital tract: Secondary | ICD-10-CM

## 2014-02-16 DIAGNOSIS — R300 Dysuria: Secondary | ICD-10-CM

## 2014-02-16 DIAGNOSIS — R109 Unspecified abdominal pain: Secondary | ICD-10-CM

## 2014-02-16 DIAGNOSIS — A6000 Herpesviral infection of urogenital system, unspecified: Secondary | ICD-10-CM

## 2014-02-16 LAB — POCT URINALYSIS DIPSTICK
Bilirubin, UA: NEGATIVE
Glucose, UA: NEGATIVE
Ketones, UA: NEGATIVE
Nitrite, UA: NEGATIVE
Spec Grav, UA: 1.025
Urobilinogen, UA: 0.2
pH, UA: 5.5

## 2014-02-16 LAB — POCT UA - MICROSCOPIC ONLY
Casts, Ur, LPF, POC: NEGATIVE
Yeast, UA: NEGATIVE

## 2014-02-16 LAB — POCT URINE PREGNANCY: Preg Test, Ur: NEGATIVE

## 2014-02-16 MED ORDER — VALACYCLOVIR HCL 1 G PO TABS: 1000.0000 mg | ORAL_TABLET | Freq: Three times a day (TID) | ORAL | Status: DC

## 2014-02-16 MED ORDER — KETOROLAC TROMETHAMINE 10 MG PO TABS: 10.0000 mg | ORAL_TABLET | Freq: Four times a day (QID) | ORAL | Status: DC | PRN

## 2014-02-16 MED ORDER — KETOROLAC TROMETHAMINE 60 MG/2ML IM SOLN
60.0000 mg | Freq: Once | INTRAMUSCULAR | Status: AC
  Administered 2014-02-16: 60 mg via INTRAMUSCULAR

## 2014-02-16 MED ORDER — TRAMADOL HCL 50 MG PO TABS: 50.0000 mg | ORAL_TABLET | Freq: Three times a day (TID) | ORAL | Status: DC | PRN

## 2014-02-16 MED ORDER — LIDOCAINE (ANORECTAL) 5 % EX GEL: CUTANEOUS | Status: DC

## 2014-02-16 NOTE — Patient Instructions (Signed)
Valtrex 1 tab 3 times daily for 5 days. Toradol as directed for pain. Ultram as directed for pain. follow up if no improvement or if worsening of your symptoms.

## 2014-02-16 NOTE — Addendum Note (Signed)
Addended by: Vira AgarFARRINGTON, Lanell Carpenter L on: 02/16/2014 09:20 PM   Modules accepted: Orders

## 2014-02-16 NOTE — Progress Notes (Signed)
   Subjective:    Patient ID: Emily Silva, female    DOB: [birth year], 23 y.o.   MRN: [id number]  HPI 23 year old female with cc of pain right flank , dysuria and outbreak of herpes past several days. Today the pain in the flank was so severe she had to take a left over OxyContin which she had from a previous kidney stone. Has no pain meds left at home now. Pain 10/10 earlier today before taking the oxycontin.no fever no vomiting no diarrhea.       Review of Systems  Constitutional: Negative.   HENT: Negative.   Eyes: Negative.   Respiratory: Negative.   Cardiovascular: Negative.   Gastrointestinal: Negative.   Endocrine: Negative.   Genitourinary: Positive for flank pain.  Allergic/Immunologic: Negative.   Neurological: Negative.   Hematological: Negative.   Psychiatric/Behavioral: Negative.   All other systems reviewed and are negative.      Objective:   Physical Exam  Nursing note and vitals reviewed. Constitutional: She is oriented to person, place, and time. She appears well-developed and well-nourished.  Pt appears comfortable in the chair does not appear to be in severe pain.  HENT:  Head: Normocephalic and atraumatic.  Nose: Nose normal.  Mouth/Throat: Oropharynx is clear and moist.  Eyes: Conjunctivae and EOM are normal. Pupils are equal, round, and reactive to light.  Neck: Normal range of motion. Neck supple.  Cardiovascular: Normal rate, regular rhythm and normal heart sounds.   Pulmonary/Chest: Effort normal and breath sounds normal.  Abdominal: Soft. Bowel sounds are normal.  cva tenderness on the right  Neurological: She is alert and oriented to person, place, and time.  Skin: Skin is warm and dry.  Psychiatric: She has a normal mood and affect. Her behavior is normal. Judgment normal.     Results for orders placed in visit on 02/16/14  POCT URINALYSIS DIPSTICK      Result Value Ref Range   Color, UA yellow     Clarity, UA hazy     Glucose, UA  neg     Bilirubin, UA neg     Ketones, UA neg     Spec Grav, UA 1.025     Blood, UA large     pH, UA 5.5     Protein, UA trace     Urobilinogen, UA 0.2     Nitrite, UA neg     Leukocytes, UA Trace    POCT UA - MICROSCOPIC ONLY      Result Value Ref Range   WBC, Ur, HPF, POC 5-10     RBC, urine, microscopic 25-35     Bacteria, U Microscopic trace     Mucus, UA 1+     Epithelial cells, urine per micros 2-4     Crystals, Ur, HPF, POC calcium oxalate     Casts, Ur, LPF, POC neg     Yeast, UA neg    POCT URINE PREGNANCY      Result Value Ref Range   Preg Test, Ur Negative     Blood in urine bacti trace leuk trace nitrites neg preg is neg    Assessment & Plan:  hepres outreak Hematuria with leuks may be a recurrent kidney stone or pylonephritis, no fever no vomiting.

## 2014-02-17 ENCOUNTER — Telehealth: Payer: Self-pay

## 2014-02-17 MED ORDER — HYDROCODONE-ACETAMINOPHEN 5-325 MG PO TABS: 1.0000 | ORAL_TABLET | Freq: Four times a day (QID) | ORAL | Status: DC | PRN

## 2014-02-17 NOTE — Telephone Encounter (Signed)
Pt has an appt at the end of the week with urology. Will come by and p/u the rx.

## 2014-02-17 NOTE — Telephone Encounter (Signed)
PATIENT STATES SHE WAS IN OUR OFFICE AND SAW DR. GOTTLIEB FOR PAIN IN HER RIGHT SIDE. SHE SAID SHE HAS BEEN UP ALL NIGHT HURTING AND THAT THE MEDICATION SHE WAS GIVEN IS NOT HELPING HER AT ALL. SHE CALLED HER PHARMACIST TO ASK ABOUT THIS MEDICINE AND HE SAID IT WAS LIKE A "STRONG TYLENOL." Emily Silva SAID SHE CALLED THIS MORNING ABOUT 8:00 AM AND LEFT A MESSAGE AND SHE HAS NOT HEARD BACK FROM US ALL DAY. SHE WOULD LIKE A CALL BACK AS SOON AS POSSIBLE. BEST PHONE [phone] (CELL)  PHARMACY CHOICE IS [facility] ON [address].  MBC

## 2014-02-17 NOTE — Telephone Encounter (Signed)
It is most likely that the patient is having another kidney stone. She will have to come into the clinic to pickup a Rx.  I have written one for her.  But if she is still having pain tomorrow she needs to call her urologist because she may need a scan to see if she is obstructed due to probably kidney for 3 days without passing.  She should strain her urine also to see if she collects it.

## 2014-03-19 ENCOUNTER — Encounter (HOSPITAL_COMMUNITY): Payer: Self-pay | Admitting: Emergency Medicine

## 2014-03-19 ENCOUNTER — Emergency Department (HOSPITAL_COMMUNITY)
Admission: EM | Admit: 2014-03-19 | Discharge: 2014-03-19 | Disposition: A | Discharge status: Home / Self Care | Payer: Managed Care, Other (non HMO) | Attending: Emergency Medicine | Admitting: Emergency Medicine

## 2014-03-19 DIAGNOSIS — N200 Calculus of kidney: Secondary | ICD-10-CM

## 2014-03-19 DIAGNOSIS — Z79899 Other long term (current) drug therapy: Secondary | ICD-10-CM | POA: Diagnosis not present

## 2014-03-19 DIAGNOSIS — R109 Unspecified abdominal pain: Secondary | ICD-10-CM | POA: Insufficient documentation

## 2014-03-19 DIAGNOSIS — N3000 Acute cystitis without hematuria: Secondary | ICD-10-CM | POA: Diagnosis not present

## 2014-03-19 DIAGNOSIS — Z87891 Personal history of nicotine dependence: Secondary | ICD-10-CM | POA: Insufficient documentation

## 2014-03-19 DIAGNOSIS — N3001 Acute cystitis with hematuria: Secondary | ICD-10-CM

## 2014-03-19 DIAGNOSIS — Z3202 Encounter for pregnancy test, result negative: Secondary | ICD-10-CM | POA: Insufficient documentation

## 2014-03-19 DIAGNOSIS — R350 Frequency of micturition: Secondary | ICD-10-CM | POA: Insufficient documentation

## 2014-03-19 DIAGNOSIS — Z792 Long term (current) use of antibiotics: Secondary | ICD-10-CM | POA: Diagnosis not present

## 2014-03-19 DIAGNOSIS — R112 Nausea with vomiting, unspecified: Secondary | ICD-10-CM | POA: Insufficient documentation

## 2014-03-19 DIAGNOSIS — R300 Dysuria: Secondary | ICD-10-CM | POA: Insufficient documentation

## 2014-03-19 DIAGNOSIS — F411 Generalized anxiety disorder: Secondary | ICD-10-CM | POA: Diagnosis not present

## 2014-03-19 DIAGNOSIS — R000 Tachycardia, unspecified: Secondary | ICD-10-CM | POA: Diagnosis not present

## 2014-03-19 LAB — COMPREHENSIVE METABOLIC PANEL
ALT: 14 U/L (ref 0–35)
AST: 16 U/L (ref 0–37)
Albumin: 4.1 g/dL (ref 3.5–5.2)
Alkaline Phosphatase: 108 U/L (ref 39–117)
Anion gap: 14 (ref 5–15)
BUN: 11 mg/dL (ref 6–23)
CO2: 26 mEq/L (ref 19–32)
Calcium: 9.9 mg/dL (ref 8.4–10.5)
Chloride: 99 mEq/L (ref 96–112)
Creatinine, Ser: 0.75 mg/dL (ref 0.50–1.10)
GFR calc Af Amer: >90 mL/min (ref >90)
GFR calc non Af Amer: >90 mL/min (ref >90)
Glucose, Bld: 107 mg/dL — ABNORMAL HIGH (ref 70–99)
Potassium: 4.3 mEq/L (ref 3.7–5.3)
Sodium: 139 mEq/L (ref 137–147)
Total Bilirubin: 0.4 mg/dL (ref 0.3–1.2)
Total Protein: 8 g/dL (ref 6.0–8.3)

## 2014-03-19 LAB — URINE MICROSCOPIC-ADD ON

## 2014-03-19 LAB — CBC WITH DIFFERENTIAL/PLATELET
Basophils Absolute: 0 10*3/uL (ref 0.0–0.1)
Basophils Relative: 0 % (ref 0–1)
Eosinophils Absolute: 0.2 10*3/uL (ref 0.0–0.7)
Eosinophils Relative: 2 % (ref 0–5)
HCT: 40.6 % (ref 36.0–46.0)
Hemoglobin: 14 g/dL (ref 12.0–15.0)
Lymphocytes Relative: 33 % (ref 12–46)
Lymphs Abs: 3.7 10*3/uL (ref 0.7–4.0)
MCH: 29.2 pg (ref 26.0–34.0)
MCHC: 34.5 g/dL (ref 30.0–36.0)
MCV: 84.8 fL (ref 78.0–100.0)
Monocytes Absolute: 0.8 10*3/uL (ref 0.1–1.0)
Monocytes Relative: 7 % (ref 3–12)
Neutro Abs: 6.7 10*3/uL (ref 1.7–7.7)
Neutrophils Relative %: 58 % (ref 43–77)
Platelets: 298 10*3/uL (ref 150–400)
RBC: 4.79 MIL/uL (ref 3.87–5.11)
RDW: 13.2 % (ref 11.5–15.5)
WBC: 11.5 10*3/uL — ABNORMAL HIGH (ref 4.0–10.5)

## 2014-03-19 LAB — URINALYSIS, ROUTINE W REFLEX MICROSCOPIC
Glucose, UA: NEGATIVE mg/dL
Ketones, ur: 15 mg/dL — AB
Nitrite: NEGATIVE
Protein, ur: 30 mg/dL — AB
Specific Gravity, Urine: 1.024 (ref 1.005–1.030)
Urobilinogen, UA: 0.2 mg/dL (ref 0.0–1.0)
pH: 5.5 (ref 5.0–8.0)

## 2014-03-19 LAB — PREGNANCY, URINE: Preg Test, Ur: NEGATIVE

## 2014-03-19 MED ORDER — ONDANSETRON HCL 4 MG/2ML IJ SOLN
4.0000 mg | Freq: Once | INTRAMUSCULAR | Status: AC
  Administered 2014-03-19: 4 mg via INTRAVENOUS
  Filled 2014-03-19: qty 2

## 2014-03-19 MED ORDER — ONDANSETRON HCL 4 MG PO TABS: 4.0000 mg | ORAL_TABLET | Freq: Four times a day (QID) | ORAL | Status: DC

## 2014-03-19 MED ORDER — HYDROMORPHONE HCL 1 MG/ML IJ SOLN
1.0000 mg | Freq: Once | INTRAMUSCULAR | Status: AC
  Administered 2014-03-19: 1 mg via INTRAVENOUS
  Filled 2014-03-19: qty 1

## 2014-03-19 MED ORDER — OXYCODONE-ACETAMINOPHEN 5-325 MG PO TABS: 2.0000 | ORAL_TABLET | ORAL | Status: DC | PRN

## 2014-03-19 MED ORDER — SODIUM CHLORIDE 0.9 % IV BOLUS (SEPSIS)
1000.0000 mL | Freq: Once | INTRAVENOUS | Status: AC
  Administered 2014-03-19: 1000 mL via INTRAVENOUS

## 2014-03-19 MED ORDER — CIPROFLOXACIN HCL 500 MG PO TABS: 500.0000 mg | ORAL_TABLET | Freq: Two times a day (BID) | ORAL | Status: DC

## 2014-03-19 MED ORDER — KETOROLAC TROMETHAMINE 30 MG/ML IJ SOLN
30.0000 mg | Freq: Once | INTRAMUSCULAR | Status: AC
  Administered 2014-03-19: 30 mg via INTRAVENOUS
  Filled 2014-03-19: qty 1

## 2014-03-19 NOTE — ED Provider Notes (Signed)
CSN: [id number]     Arrival date & time 03/19/14  0406 History   First MD Initiated Contact with Patient 03/19/14 0559     Chief Complaint  Patient presents with  . Back Pain  . Urinary Frequency     (Consider location/radiation/quality/duration/timing/severity/associated sxs/prior Treatment) HPI Emily Silva is a 23 year old female with past medical history of kidney stones with dysuria and hematuria since last Friday. Patient states she went to urgent care on Friday and received Bactrim for a UTI, however did not fill her prescription until last night. Patient states at midnight last night she experienced an acute onset of right flank pain which radiated into her abdomen. Patient states her pain is an 8 or 9/10, the quality is an aching pain, which comes and goes.  Patient states her pain is identical to previous kidney stones. Patient states this morning she was unable to make it to time when she could follow up with urology because her pain was too intolerable. Patient has mild nausea. Patient denies having any associated vomiting, fever, vaginal pain, discharge. LMP 2 weeks ago. Not sexually active.  Past Medical History  Diagnosis Date  . ADHD (attention deficit hyperactivity disorder)   . Anxiety disorder   . Right ureteral stone   . Hematuria   . Frequency of urination   . History of kidney stones    Past Surgical History  Procedure Laterality Date  . Wisdom tooth extraction    . Cystoscopy with ureteroscopy and stent placement Right 03/17/2013    Procedure: CYSTOSCOPY WITH RIGHT URETEROSCOPY HOLMIUM LASER OF STONESER LITHOTRIPSY AND STENT PLACEMENT;  Surgeon: Garnett Farm, MD;  Location: [facility];  Service: Urology;  Laterality: Right;   Family History  Problem Relation Age of Onset  . Cancer Father    History  Substance Use Topics  . Smoking status: Former Smoker    Types: Cigarettes    Quit date: 12/31/2013  . Smokeless tobacco: Never Used   Comment: occasional smoker of cigarettes and e-cig since 2012  . Alcohol Use: Yes     Comment: occasional   OB History   Grav Para Term Preterm Abortions TAB SAB Ect Mult Living                 Review of Systems  Constitutional: Negative for fever.  HENT: Negative for trouble swallowing.   Eyes: Negative for visual disturbance.  Respiratory: Negative for shortness of breath.   Cardiovascular: Negative for chest pain.  Gastrointestinal: Positive for nausea, vomiting and abdominal pain. Negative for diarrhea and blood in stool.  Genitourinary: Positive for dysuria, frequency, hematuria and flank pain.  Musculoskeletal: Negative for neck pain.  Skin: Negative for rash.  Neurological: Negative for dizziness, weakness and numbness.  Psychiatric/Behavioral: Negative.       Allergies  Nickel  Home Medications   Prior to Admission medications   Medication Sig Start Date End Date Taking? Authorizing Provider  ALPRAZolam (XANAX XR) 1 MG 24 hr tablet Take 1 mg by mouth every morning.   Yes Historical Provider, MD  ALPRAZolam Prudy Feeler) 1 MG tablet Take 1 mg by mouth 3 (three) times daily as needed for anxiety.   Yes Historical Provider, MD  amphetamine-dextroamphetamine (ADDERALL XR) 15 MG 24 hr capsule Take 15 mg by mouth every morning.   Yes Historical Provider, MD  sulfamethoxazole-trimethoprim (BACTRIM DS,SEPTRA DS) 800-160 MG per tablet Take 1 tablet by mouth daily.   Yes Historical Provider, MD  tamsulosin (FLOMAX) 0.4 MG  CAPS capsule Take 0.4 mg by mouth daily.    Yes Historical Provider, MD  traMADol (ULTRAM) 50 MG tablet Take 50 mg by mouth every 6 (six) hours as needed.   Yes Historical Provider, MD  valACYclovir (VALTREX) 1000 MG tablet Take 1,000 mg by mouth daily as needed (outbreak).    Yes Historical Provider, MD  ciprofloxacin (CIPRO) 500 MG tablet Take 1 tablet (500 mg total) by mouth 2 (two) times daily. One po bid x 7 days 03/19/14   Monte Fantasia, PA-C  ondansetron  (ZOFRAN) 4 MG tablet Take 1 tablet (4 mg total) by mouth every 6 (six) hours. 03/19/14   Monte Fantasia, PA-C  oxyCODONE-acetaminophen (PERCOCET) 5-325 MG per tablet Take 2 tablets by mouth every 4 (four) hours as needed. 03/19/14   Monte Fantasia, PA-C   BP 131/95  Pulse 97  Temp(Src) 98.1 F (36.7 C) (Oral)  Resp 20  Ht  (1.803 m)  Wt 195 lb (88.451 kg)  BMI 27.21 kg/m2  SpO2 100%  LMP 03/04/2014 Physical Exam  Nursing note and vitals reviewed. Constitutional: She is oriented to person, place, and time. She appears well-developed and well-nourished. No distress.  HENT:  Head: Normocephalic and atraumatic.  Mouth/Throat: Oropharynx is clear and moist. No oropharyngeal exudate.  Eyes: Right eye exhibits no discharge. Left eye exhibits no discharge. No scleral icterus.  Neck: Normal range of motion.  Cardiovascular: Regular rhythm and normal heart sounds.  Tachycardia present.   No murmur heard. Tachycardic at 115 on exam.  Pulmonary/Chest: Effort normal and breath sounds normal. No respiratory distress.  Abdominal: Soft. Normal appearance and bowel sounds are normal. There is tenderness in the right upper quadrant. There is CVA tenderness. There is no rigidity, no guarding, no tenderness at McBurney's point and negative Murphy's sign.  Mild right upper quadrant tenderness with no point tenderness.   Musculoskeletal: Normal range of motion. She exhibits no edema and no tenderness.  Neurological: She is alert and oriented to person, place, and time. No cranial nerve deficit. Coordination normal.  Skin: Skin is warm and dry. No rash noted. She is not diaphoretic.  Psychiatric: She has a normal mood and affect.    ED Course  Procedures (including critical care time) Labs Review Labs Reviewed  URINALYSIS, ROUTINE W REFLEX MICROSCOPIC - Abnormal; Notable for the following:    Color, Urine RED (*)    APPearance CLOUDY (*)    Hgb urine dipstick LARGE (*)    Bilirubin Urine SMALL  (*)    Ketones, ur 15 (*)    Protein, ur 30 (*)    Leukocytes, UA SMALL (*)    All other components within normal limits  CBC WITH DIFFERENTIAL - Abnormal; Notable for the following:    WBC 11.5 (*)    All other components within normal limits  COMPREHENSIVE METABOLIC PANEL - Abnormal; Notable for the following:    Glucose, Bld 107 (*)    All other components within normal limits  URINE MICROSCOPIC-ADD ON - Abnormal; Notable for the following:    Squamous Epithelial / LPF FEW (*)    Bacteria, UA MANY (*)    All other components within normal limits  URINE CULTURE  PREGNANCY, URINE    Imaging Review No results found.   EKG Interpretation None      MDM   Final diagnoses:  Kidney stone  Acute cystitis with hematuria    23 year old female with past medical history of kidney stones, with dysuria  and hematuria since last Friday. Went to urgent care received Bactrim, did not fill back up to last night. Patient with right flank pain radiating into her abdomen since approximately midnight. Patient stating her pain is identical to previous kidney stones. She states she was unable to make it to her appointment with urology because of pain. Mild nausea, vomiting. No fever, chills. No vaginal discharge, bleeding. Last LMP 2 weeks ago. Not sexually active.  With past history of kidney stones, and patient stating this feels the same today, workup to include symptomatic relief. We will place a urine culture on.   8:20 AM: Patient states her pain has decreased to a "tolerable level". Patient states she is ready to go this time.  Pt has been diagnosed with a Kidney Stone via history, physical and UA.  Serum creatine WNL, vitals sign stable and the pt does not have irratractable vomiting. Pt has signs of UTI.  We'll place patient on Cipro. I encouraged patient to discontinue her Bactrim. Cipro may be a better option for a UTI with a stone. Pt will be dc home with pain medications & has been  advised to follow up with urology.  Patient's states that she feels much comfortable leaving, and will followup with urology today. I encouraged patient to call or return to the ER should her symptoms persist, change, worsen or should she have any questions or concerns.  BP 131/95  Pulse 97  Temp(Src) 98.1 F (36.7 C) (Oral)  Resp 20  Ht  (1.803 m)  Wt 195 lb (88.451 kg)  BMI 27.21 kg/m2  SpO2 100%  LMP 03/04/2014   Signed,  Ladona Mow, PA-C 5:44 PM   This patient discussed with Dr. Marisa Severin, MD.      Monte Fantasia, PA-C 03/19/14 1744

## 2014-03-19 NOTE — Discharge Instructions (Signed)
Followup with [facility] for your kidney stone. Use Percocet 1-2 pills every 4-6 hours as needed for pain. Use Zofran every 6 hours as needed for nausea/vomiting. Use Cipro 1 pill 2 times a day for 7 days for infection. Return to the ER if your pain or nausea is uncontrollable, or if you develop a fever greater than 100.4  Kidney Stones Kidney stones (urolithiasis) are deposits that form inside your kidneys. The intense pain is caused by the stone moving through the urinary tract. When the stone moves, the ureter goes into spasm around the stone. The stone is usually passed in the urine.  CAUSES   A disorder that makes certain neck glands produce too much parathyroid hormone (primary hyperparathyroidism).  A buildup of uric acid crystals, similar to gout in your joints.  Narrowing (stricture) of the ureter.  A kidney obstruction present at birth (congenital obstruction).  Previous surgery on the kidney or ureters.  Numerous kidney infections. SYMPTOMS   Feeling sick to your stomach (nauseous).  Throwing up (vomiting).  Blood in the urine (hematuria).  Pain that usually spreads (radiates) to the groin.  Frequency or urgency of urination. DIAGNOSIS   Taking a history and physical exam.  Blood or urine tests.  CT scan.  Occasionally, an examination of the inside of the urinary bladder (cystoscopy) is performed. TREATMENT   Observation.  Increasing your fluid intake.  Extracorporeal shock wave lithotripsy--This is a noninvasive procedure that uses shock waves to break up kidney stones.  Surgery may be needed if you have severe pain or persistent obstruction. There are various surgical procedures. Most of the procedures are performed with the use of small instruments. Only small incisions are needed to accommodate these instruments, so recovery time is minimized. The size, location, and chemical composition are all important variables that will determine the proper  choice of action for you. Talk to your health care provider to better understand your situation so that you will minimize the risk of injury to yourself and your kidney.  HOME CARE INSTRUCTIONS   Drink enough water and fluids to keep your urine clear or pale yellow. This will help you to pass the stone or stone fragments.  Strain all urine through the provided strainer. Keep all particulate matter and stones for your health care provider to see. The stone causing the pain may be as small as a grain of salt. It is very important to use the strainer each and every time you pass your urine. The collection of your stone will allow your health care provider to analyze it and verify that a stone has actually passed. The stone analysis will often identify what you can do to reduce the incidence of recurrences.  Only take over-the-counter or prescription medicines for pain, discomfort, or fever as directed by your health care provider.  Make a follow-up appointment with your health care provider as directed.  Get follow-up X-rays if required. The absence of pain does not always mean that the stone has passed. It may have only stopped moving. If the urine remains completely obstructed, it can cause loss of kidney function or even complete destruction of the kidney. It is your responsibility to make sure X-rays and follow-ups are completed. Ultrasounds of the kidney can show blockages and the status of the kidney. Ultrasounds are not associated with any radiation and can be performed easily in a matter of minutes. SEEK MEDICAL CARE IF:  You experience pain that is progressive and unresponsive to any pain  medicine you have been prescribed. SEEK IMMEDIATE MEDICAL CARE IF:   Pain cannot be controlled with the prescribed medicine.  You have a fever or shaking chills.  The severity or intensity of pain increases over 18 hours and is not relieved by pain medicine.  You develop a new onset of abdominal  pain.  You feel faint or pass out.  You are unable to urinate. MAKE SURE YOU:   Understand these instructions.  Will watch your condition.  Will get help right away if you are not doing well or get worse. Document Released: 06/19/2005 Document Revised: 02/19/2013 Document Reviewed: 11/20/2012 [facility] Patient Information 2015 [place], [place]. This information is not intended to replace advice given to you by your health care provider. Make sure you discuss any questions you have with your health care provider.  Urinary Tract Infection Urinary tract infections (UTIs) can develop anywhere along your urinary tract. Your urinary tract is your body's drainage system for removing wastes and extra water. Your urinary tract includes two kidneys, two ureters, a bladder, and a urethra. Your kidneys are a pair of bean-shaped organs. Each kidney is about the size of your fist. They are located below your ribs, one on each side of your spine. CAUSES Infections are caused by microbes, which are microscopic organisms, including fungi, viruses, and bacteria. These organisms are so small that they can only be seen through a microscope. Bacteria are the microbes that most commonly cause UTIs. SYMPTOMS  Symptoms of UTIs may vary by age and gender of the patient and by the location of the infection. Symptoms in young women typically include a frequent and intense urge to urinate and a painful, burning feeling in the bladder or urethra during urination. Older women and men are more likely to be tired, shaky, and weak and have muscle aches and abdominal pain. A fever may mean the infection is in your kidneys. Other symptoms of a kidney infection include pain in your back or sides below the ribs, nausea, and vomiting. DIAGNOSIS To diagnose a UTI, your caregiver will ask you about your symptoms. Your caregiver also will ask to provide a urine sample. The urine sample will be tested for bacteria and white blood cells.  White blood cells are made by your body to help fight infection. TREATMENT  Typically, UTIs can be treated with medication. Because most UTIs are caused by a bacterial infection, they usually can be treated with the use of antibiotics. The choice of antibiotic and length of treatment depend on your symptoms and the type of bacteria causing your infection. HOME CARE INSTRUCTIONS  If you were prescribed antibiotics, take them exactly as your caregiver instructs you. Finish the medication even if you feel better after you have only taken some of the medication.  Drink enough water and fluids to keep your urine clear or pale yellow.  Avoid caffeine, tea, and carbonated beverages. They tend to irritate your bladder.  Empty your bladder often. Avoid holding urine for long periods of time.  Empty your bladder before and after sexual intercourse.  After a bowel movement, women should cleanse from front to back. Use each tissue only once. SEEK MEDICAL CARE IF:   You have back pain.  You develop a fever.  Your symptoms do not begin to resolve within 3 days. SEEK IMMEDIATE MEDICAL CARE IF:   You have severe back pain or lower abdominal pain.  You develop chills.  You have nausea or vomiting.  You have continued burning or  discomfort with urination. MAKE SURE YOU:   Understand these instructions.  Will watch your condition.  Will get help right away if you are not doing well or get worse. Document Released: 03/29/2005 Document Revised: 12/19/2011 Document Reviewed: 07/28/2011 [facility] Patient Information 2015 [place], [place]. This information is not intended to replace advice given to you by your health care provider. Make sure you discuss any questions you have with your health care provider.

## 2014-03-19 NOTE — ED Notes (Signed)
MD at bedside. 

## 2014-03-19 NOTE — ED Notes (Signed)
Pt. reports right lower back pain , urinary frequency and hematuria onset several days ago , history of kidney stones , denies fever or chills.

## 2014-03-20 NOTE — ED Provider Notes (Signed)
Medical screening examination/treatment/procedure(s) were performed by non-physician practitioner and as supervising physician I was immediately available for consultation/collaboration.   EKG Interpretation None       Taila Basinski M Taygan Connell, MD 03/20/14 1622 

## 2014-03-21 LAB — URINE CULTURE: Colony Count: >=100000

## 2014-03-31 ENCOUNTER — Other Ambulatory Visit: Payer: Self-pay

## 2014-03-31 ENCOUNTER — Other Ambulatory Visit (HOSPITAL_COMMUNITY)
Admission: RE | Admit: 2014-03-31 | Discharge: 2014-03-31 | Disposition: A | Discharge status: Home / Self Care | Payer: Managed Care, Other (non HMO) | Source: Ambulatory Visit | Attending: Family Medicine | Admitting: Family Medicine

## 2014-03-31 DIAGNOSIS — Z0000 Encounter for general adult medical examination without abnormal findings: Secondary | ICD-10-CM | POA: Insufficient documentation

## 2014-04-02 LAB — CYTOLOGY - PAP

## 2014-04-05 ENCOUNTER — Ambulatory Visit (INDEPENDENT_AMBULATORY_CARE_PROVIDER_SITE_OTHER): Payer: Managed Care, Other (non HMO) | Admitting: Emergency Medicine

## 2014-04-05 VITALS — BP 128/70 | HR 83 | Temp 98.3°F | Resp 16 | Ht 70.0 in | Wt 204.2 lb

## 2014-04-05 DIAGNOSIS — S61002A Unspecified open wound of left thumb without damage to nail, initial encounter: Secondary | ICD-10-CM

## 2014-04-05 DIAGNOSIS — Z23 Encounter for immunization: Secondary | ICD-10-CM

## 2014-04-05 DIAGNOSIS — S61012A Laceration without foreign body of left thumb without damage to nail, initial encounter: Secondary | ICD-10-CM

## 2014-04-05 DIAGNOSIS — J209 Acute bronchitis, unspecified: Secondary | ICD-10-CM

## 2014-04-05 MED ORDER — AZITHROMYCIN 250 MG PO TABS: ORAL_TABLET | ORAL | Status: DC

## 2014-04-05 MED ORDER — PROMETHAZINE-CODEINE 6.25-10 MG/5ML PO SYRP: 5.0000 mL | ORAL_SOLUTION | Freq: Four times a day (QID) | ORAL | Status: DC | PRN

## 2014-04-05 MED ORDER — CEPHALEXIN 500 MG PO CAPS: 500.0000 mg | ORAL_CAPSULE | Freq: Four times a day (QID) | ORAL | Status: DC

## 2014-04-05 NOTE — Progress Notes (Signed)
Verbal consent obtained from patient.  Local anesthesia by digital block with 4cc Lidocaine 2% without epinephrine.  Wound scrubbed with soap and water and rinsed.  Wound closed with #3 5-0 Prolene simple interrupted sutures.  Wound cleansed and dressed.

## 2014-04-05 NOTE — Patient Instructions (Addendum)

## 2014-04-05 NOTE — Progress Notes (Signed)
[facility] [address] [place] [address] [phone] [phone]  Date:  04/05/2014   Name:  Emily Silva   DOB:  04-13-1991   MRN:  [id number]  PCP:  Boneta Lucks, NP    Chief Complaint: Laceration and Sore Throat   History of Present Illness:  NANCYLEE GAINES is a 23 y.o. very pleasant female patient who presents with the following:  Two complaints.  Last night at 1100 she cut her left thumb on a can.  Needs tetanus. Has a watery nasal drainage and post nasal drip.  Sore throat.  Has a cough productive of scant sputum.  Some wheezing and exertional shortness of breath. No nausea or vomiting.  No stool change. No rash. No improvement with over the counter medications or other home remedies.  Denies other complaint or health concern today.   Patient Active Problem List   Diagnosis Date Noted  . Ureteral calculus, right 03/16/2013    Past Medical History  Diagnosis Date  . ADHD (attention deficit hyperactivity disorder)   . Anxiety disorder   . Right ureteral stone   . Hematuria   . Frequency of urination   . History of kidney stones     Past Surgical History  Procedure Laterality Date  . Wisdom tooth extraction    . Cystoscopy with ureteroscopy and stent placement Right 03/17/2013    Procedure: CYSTOSCOPY WITH RIGHT URETEROSCOPY HOLMIUM LASER OF STONESER LITHOTRIPSY AND STENT PLACEMENT;  Surgeon: Garnett Farm, MD;  Location: [facility];  Service: Urology;  Laterality: Right;    History  Substance Use Topics  . Smoking status: Former Smoker    Types: Cigarettes    Quit date: 12/31/2013  . Smokeless tobacco: Never Used     Comment: occasional smoker of cigarettes and e-cig since 2012  . Alcohol Use: Yes     Comment: occasional    Family History  Problem Relation Age of Onset  . Cancer Father     Allergies  Allergen Reactions  . Nickel Rash    Medication list has been reviewed and updated.  Current Outpatient  Prescriptions on File Prior to Visit  Medication Sig Dispense Refill  . ALPRAZolam (XANAX XR) 1 MG 24 hr tablet Take 1 mg by mouth every morning.      Marland Kitchen ALPRAZolam (XANAX) 1 MG tablet Take 1 mg by mouth 3 (three) times daily as needed for anxiety.      Marland Kitchen amphetamine-dextroamphetamine (ADDERALL XR) 15 MG 24 hr capsule Take 15 mg by mouth every morning.      . ciprofloxacin (CIPRO) 500 MG tablet Take 1 tablet (500 mg total) by mouth 2 (two) times daily. One po bid x 7 days  14 tablet  0  . ondansetron (ZOFRAN) 4 MG tablet Take 1 tablet (4 mg total) by mouth every 6 (six) hours.  12 tablet  0  . oxyCODONE-acetaminophen (PERCOCET) 5-325 MG per tablet Take 2 tablets by mouth every 4 (four) hours as needed.  20 tablet  0  . sulfamethoxazole-trimethoprim (BACTRIM DS,SEPTRA DS) 800-160 MG per tablet Take 1 tablet by mouth daily.      . tamsulosin (FLOMAX) 0.4 MG CAPS capsule Take 0.4 mg by mouth daily.       . traMADol (ULTRAM) 50 MG tablet Take 50 mg by mouth every 6 (six) hours as needed.      . valACYclovir (VALTREX) 1000 MG tablet Take 1,000 mg by mouth daily as needed (outbreak).  No current facility-administered medications on file prior to visit.    Review of Systems:  As per HPI, otherwise negative.    Physical Examination: Filed Vitals:   04/05/14 1508  BP: 128/70  Pulse: 83  Temp: 98.3 F (36.8 C)  Resp: 16   Filed Vitals:   04/05/14 1508  Height: 5\' 10"  (1.778 m)  Weight: 204 lb 3.2 oz (92.625 kg)   Body mass index is 29.3 kg/(m^2). Ideal Body Weight: Weight in (lb) to have BMI = 25: 173.9  GEN: WDWN, NAD, Non-toxic, A & O x 3 HEENT: Atraumatic, Normocephalic. Neck supple. No masses, No LAD. Ears and Nose: No external deformity. CV: RRR, No M/G/R. No JVD. No thrill. No extra heart sounds. PULM: CTA B, no wheezes, crackles, rhonchi. No retractions. No resp. distress. No accessory muscle use. ABD: S, NT, ND, +BS. No rebound. No HSM. EXTR: No c/c/e NEURO Normal  gait.  PSYCH: Normally interactive. Conversant. Not depressed or anxious appearing.  Calm demeanor.  Left thumb:  1.5 cm three corner laceration terminal phalange on flexor surface.  NATI, no FB  Assessment and Plan: Bronchitis zpak  Laceration thumb Keflex TD  Signed,  Phillips OdorJeffery Alnita Aybar, MD

## 2014-04-12 ENCOUNTER — Ambulatory Visit (INDEPENDENT_AMBULATORY_CARE_PROVIDER_SITE_OTHER): Payer: Managed Care, Other (non HMO) | Admitting: Emergency Medicine

## 2014-04-12 VITALS — BP 122/84 | HR 93 | Temp 98.1°F | Resp 16 | Ht 70.0 in | Wt 206.0 lb

## 2014-04-12 DIAGNOSIS — Z4802 Encounter for removal of sutures: Secondary | ICD-10-CM

## 2014-04-12 NOTE — Progress Notes (Signed)
[facility] [address], [place] [id number] [phone]  Date:  04/12/2014   Name:  Emily Silva   DOB:  05/15/1991   MRN:  [id number]  PCP:  Boneta LucksBrown, Jennifer, NP    Chief Complaint: Suture / Staple Removal   History of Present Illness:  Emily Silva is a 23 y.o. very pleasant female patient who presents with the following:  For suture removal  Patient Active Problem List   Diagnosis Date Noted  . Ureteral calculus, right 03/16/2013    Past Medical History  Diagnosis Date  . ADHD (attention deficit hyperactivity disorder)   . Anxiety disorder   . Right ureteral stone   . Hematuria   . Frequency of urination   . History of kidney stones     Past Surgical History  Procedure Laterality Date  . Wisdom tooth extraction    . Cystoscopy with ureteroscopy and stent placement Right 03/17/2013    Procedure: CYSTOSCOPY WITH RIGHT URETEROSCOPY HOLMIUM LASER OF STONESER LITHOTRIPSY AND STENT PLACEMENT;  Surgeon: Garnett FarmMark C Ottelin, MD;  [facility];  Service: Urology;  Laterality: Right;    History  Substance Use Topics  . Smoking status: Former Smoker    Types: Cigarettes    Quit date: 12/31/2013  . Smokeless tobacco: Never Used     Comment: occasional smoker of cigarettes and e-cig since 2012  . Alcohol Use: Yes     Comment: occasional    Family History  Problem Relation Age of Onset  . Cancer Father     Allergies  Allergen Reactions  . Nickel Rash    Medication list has been reviewed and updated.  Current Outpatient Prescriptions on File Prior to Visit  Medication Sig Dispense Refill  . ALPRAZolam (XANAX XR) 1 MG 24 hr tablet Take 1 mg by mouth every morning.      Marland Kitchen. ALPRAZolam (XANAX) 1 MG tablet Take 1 mg by mouth 3 (three) times daily as needed for anxiety.      Marland Kitchen. amphetamine-dextroamphetamine (ADDERALL XR) 15 MG 24 hr capsule Take 15 mg by mouth every morning.      . cephALEXin (KEFLEX) 500 MG capsule  Take 1 capsule (500 mg total) by mouth 4 (four) times daily.  30 capsule  0  . JUNEL FE 1/20 1-20 MG-MCG tablet       . promethazine-codeine (PHENERGAN WITH CODEINE) 6.25-10 MG/5ML syrup Take 5-10 mLs by mouth every 6 (six) hours as needed.  120 mL  0  . valACYclovir (VALTREX) 1000 MG tablet Take 1,000 mg by mouth daily as needed (outbreak).        No current facility-administered medications on file prior to visit.    Review of Systems:  As per HPI, otherwise negative.    Physical Examination: Filed Vitals:   04/12/14 1154  BP: 122/84  Pulse: 93  Temp: 98.1 F (36.7 C)  Resp: 16   Filed Vitals:   04/12/14 1154  Height: 5\' 10"  (1.778 m)  Weight: 206 lb (93.441 kg)   Body mass index is 29.56 kg/(m^2). Ideal Body Weight: Weight in (lb) to have BMI = 25: 173.9   GEN: WDWN, NAD, Non-toxic, Alert & Oriented x 3 HEENT: Atraumatic, Normocephalic.  Ears and Nose: No external deformity. EXTR: No clubbing/cyanosis/edema NEURO: Normal gait.  PSYCH: Normally interactive. Conversant. Not depressed or anxious appearing.  Calm demeanor.  Wound healing well with no evidence infection  Assessment and Plan: Sutures removed  Signed,  Ellison Carwin, MD

## 2014-04-12 NOTE — Patient Instructions (Signed)

## 2014-06-03 ENCOUNTER — Ambulatory Visit (INDEPENDENT_AMBULATORY_CARE_PROVIDER_SITE_OTHER): Payer: Managed Care, Other (non HMO) | Admitting: Family Medicine

## 2014-06-03 ENCOUNTER — Ambulatory Visit (INDEPENDENT_AMBULATORY_CARE_PROVIDER_SITE_OTHER): Payer: Managed Care, Other (non HMO)

## 2014-06-03 VITALS — BP 134/98 | HR 91 | Temp 97.8°F | Resp 20 | Ht 70.0 in | Wt 204.6 lb

## 2014-06-03 DIAGNOSIS — Z87442 Personal history of urinary calculi: Secondary | ICD-10-CM

## 2014-06-03 DIAGNOSIS — R300 Dysuria: Secondary | ICD-10-CM

## 2014-06-03 DIAGNOSIS — N200 Calculus of kidney: Secondary | ICD-10-CM

## 2014-06-03 DIAGNOSIS — N3001 Acute cystitis with hematuria: Secondary | ICD-10-CM

## 2014-06-03 DIAGNOSIS — R109 Unspecified abdominal pain: Secondary | ICD-10-CM

## 2014-06-03 LAB — POCT UA - MICROSCOPIC ONLY
Casts, Ur, LPF, POC: NEGATIVE
Mucus, UA: NEGATIVE
Yeast, UA: NEGATIVE

## 2014-06-03 LAB — POCT URINALYSIS DIPSTICK
Bilirubin, UA: NEGATIVE
Glucose, UA: NEGATIVE
Ketones, UA: NEGATIVE
Nitrite, UA: NEGATIVE
Protein, UA: NEGATIVE
Spec Grav, UA: 1.025
Urobilinogen, UA: 0.2
pH, UA: 5.5

## 2014-06-03 MED ORDER — HYDROCODONE-ACETAMINOPHEN 5-325 MG PO TABS: 1.0000 | ORAL_TABLET | Freq: Four times a day (QID) | ORAL | Status: DC | PRN

## 2014-06-03 MED ORDER — CIPROFLOXACIN HCL 500 MG PO TABS: 500.0000 mg | ORAL_TABLET | Freq: Two times a day (BID) | ORAL | Status: DC

## 2014-06-03 NOTE — Patient Instructions (Signed)
Start Cipro for possible infection, hydrocodone if needed for pain, and follow [facility] with [facility] or here if not improved in 2 days, or if improving - follow up with urologist next week). You should receive a call or letter about your lab results within the next week to 10 days.  Return to the clinic or go to the nearest emergency room if any of your symptoms worsen or new symptoms occur.    Kidney Stones Kidney stones (urolithiasis) are deposits that form inside your kidneys. The intense pain is caused by the stone moving through the urinary tract. When the stone moves, the ureter goes into spasm around the stone. The stone is usually passed in the urine.  CAUSES   A disorder that makes certain neck glands produce too much parathyroid hormone (primary hyperparathyroidism).  A buildup of uric acid crystals, similar to gout in your joints.  Narrowing (stricture) of the ureter.  A kidney obstruction present at birth (congenital obstruction).  Previous surgery on the kidney or ureters.  Numerous kidney infections. SYMPTOMS   Feeling sick to your stomach (nauseous).  Throwing up (vomiting).  Blood in the urine (hematuria).  Pain that usually spreads (radiates) to the groin.  Frequency or urgency of urination. DIAGNOSIS   Taking a history and physical exam.  Blood or urine tests.  CT scan.  Occasionally, an examination of the inside of the urinary bladder (cystoscopy) is performed. TREATMENT   Observation.  Increasing your fluid intake.  Extracorporeal shock wave lithotripsy--This is a noninvasive procedure that uses shock waves to break up kidney stones.  Surgery may be needed if you have severe pain or persistent obstruction. There are various surgical procedures. Most of the procedures are performed with the use of small instruments. Only small incisions are needed to accommodate these instruments, so recovery time is minimized. The size, location, and chemical  composition are all important variables that will determine the proper choice of action for you. Talk to your health care provider to better understand your situation so that you will minimize the risk of injury to yourself and your kidney.  HOME CARE INSTRUCTIONS   Drink enough water and fluids to keep your urine clear or pale yellow. This will help you to pass the stone or stone fragments.  Strain all urine through the provided strainer. Keep all particulate matter and stones for your health care provider to see. The stone causing the pain may be as small as a grain of salt. It is very important to use the strainer each and every time you pass your urine. The collection of your stone will allow your health care provider to analyze it and verify that a stone has actually passed. The stone analysis will often identify what you can do to reduce the incidence of recurrences.  Only take over-the-counter or prescription medicines for pain, discomfort, or fever as directed by your health care provider.  Make a follow-up appointment with your health care provider as directed.  Get follow-up X-rays if required. The absence of pain does not always mean that the stone has passed. It may have only stopped moving. If the urine remains completely obstructed, it can cause loss of kidney function or even complete destruction of the kidney. It is your responsibility to make sure X-rays and follow-ups are completed. Ultrasounds of the kidney can show blockages and the status of the kidney. Ultrasounds are not associated with any radiation and can be performed easily in a matter of minutes. SEEK  MEDICAL CARE IF:  You experience pain that is progressive and unresponsive to any pain medicine you have been prescribed. SEEK IMMEDIATE MEDICAL CARE IF:   Pain cannot be controlled with the prescribed medicine.  You have a fever or shaking chills.  The severity or intensity of pain increases over 18 hours and is not  relieved by pain medicine.  You develop a new onset of abdominal pain.  You feel faint or pass out.  You are unable to urinate. MAKE SURE YOU:   Understand these instructions.  Will watch your condition.  Will get help right away if you are not doing well or get worse. Document Released: 06/19/2005 Document Revised: 02/19/2013 Document Reviewed: 11/20/2012 [facility] Patient Information 2015 [place], [place]. This information is not intended to replace advice given to you by your health care provider. Make sure you discuss any questions you have with your health care provider. Urinary Tract Infection Urinary tract infections (UTIs) can develop anywhere along your urinary tract. Your urinary tract is your body's drainage system for removing wastes and extra water. Your urinary tract includes two kidneys, two ureters, a bladder, and a urethra. Your kidneys are a pair of bean-shaped organs. Each kidney is about the size of your fist. They are located below your ribs, one on each side of your spine. CAUSES Infections are caused by microbes, which are microscopic organisms, including fungi, viruses, and bacteria. These organisms are so small that they can only be seen through a microscope. Bacteria are the microbes that most commonly cause UTIs. SYMPTOMS  Symptoms of UTIs may vary by age and gender of the patient and by the location of the infection. Symptoms in young women typically include a frequent and intense urge to urinate and a painful, burning feeling in the bladder or urethra during urination. Older women and men are more likely to be tired, shaky, and weak and have muscle aches and abdominal pain. A fever may mean the infection is in your kidneys. Other symptoms of a kidney infection include pain in your back or sides below the ribs, nausea, and vomiting. DIAGNOSIS To diagnose a UTI, your caregiver will ask you about your symptoms. Your caregiver also will ask to provide a urine sample. The  urine sample will be tested for bacteria and white blood cells. White blood cells are made by your body to help fight infection. TREATMENT  Typically, UTIs can be treated with medication. Because most UTIs are caused by a bacterial infection, they usually can be treated with the use of antibiotics. The choice of antibiotic and length of treatment depend on your symptoms and the type of bacteria causing your infection. HOME CARE INSTRUCTIONS  If you were prescribed antibiotics, take them exactly as your caregiver instructs you. Finish the medication even if you feel better after you have only taken some of the medication.  Drink enough water and fluids to keep your urine clear or pale yellow.  Avoid caffeine, tea, and carbonated beverages. They tend to irritate your bladder.  Empty your bladder often. Avoid holding urine for long periods of time.  Empty your bladder before and after sexual intercourse.  After a bowel movement, women should cleanse from front to back. Use each tissue only once. SEEK MEDICAL CARE IF:   You have back pain.  You develop a fever.  Your symptoms do not begin to resolve within 3 days. SEEK IMMEDIATE MEDICAL CARE IF:   You have severe back pain or lower abdominal pain.  You  develop chills.  You have nausea or vomiting.  You have continued burning or discomfort with urination. MAKE SURE YOU:   Understand these instructions.  Will watch your condition.  Will get help right away if you are not doing well or get worse. Document Released: 03/29/2005 Document Revised: 12/19/2011 Document Reviewed: 07/28/2011 [facility] Patient Information 2015 [place], [place]. This information is not intended to replace advice given to you by your health care provider. Make sure you discuss any questions you have with your health care provider.

## 2014-06-03 NOTE — Progress Notes (Signed)
This chart was scribed for Meredith Staggers, MD by Tonye Royalty, ED Scribe. This patient was seen in room 5 and the patient's care was started at 7:23 PM.   Subjective:    Patient ID: Emily Silva, female    DOB: 07/25/1990, 23 y.o.   MRN: [id number]  Chief Complaint  Patient presents with  . Flank Pain    right sided flank pain that started yesterday.  nausea, vomiting.  hx of kidney stones. hematuria x 2 days.     HPI  HPI Comments: Emily Silva is a 23 y.o. female with history of nephrolithiasis who presents to the [facility] complaining of right flank pain with onset yesterday with associated nausea, vomiting, and hematuria. On review of [facility], she was admitted in September of last year by Dr. Vernie Ammons. At that time, had a 5mm right sided stone at UP junction, underwent lithotripsy and ureteral stone extraction and stent placement. She's had recurrence of pain with treatment of pyuria in March of this year with Cipro but negative urine culture at that time. Was treated with Percocet at that time for possible kidney stone. Seen again August 17 with right flank pain and hematuria. See phone messages regarding pain medication at that time. She was again seen September 17 in the ED with flank pain and back pain. Per that note, she had went to an urgent care just prior, received a prescription for Bactrim for a UTI, but pain similar to previous kidney stones. She is noted to have large blood in her urine again with small LE and elevated WBCs on CBC of 11.5. She was started to Cipro in place of Bactrim, discharged on pain medication, and to follow up with urology that day.  She reports right flank pain with associated nausea, vomiting, and hematuria with onset yesterday. She states she works at a daycare. She reports associated frequency, urgency, and dysuria. She states she had hematuria yesterday but not today. She states pain had improved some today, but then worsened again  and has been worse in the past 2 hours than it was yesterday. She states she last vomited yesterday afternoon and note she has not eaten today. She states she last saw her urologist after her ED visit in September approximately 4 months ago; she states she has difficulty getting time off work. She states she last had a small kidney stone at that time, which she passed. She states they sent her a 24 urine test which she has not done yet, and she also sent them her last stone for analysis. She states she has passed approximately 5 stones this year. She states she takes Xanax as needed and Adderall, prescribed from [facility]. She states she last used Percocet prescribed in September, but it unsure if it was from the ED or from her urologist. She states she last saw her PCP within 3 months ago for back sprain from her work and they prescribed her pain medication. She states Vicodin and Percocet have improved her pain. She denies ever having felt addicted to her pain medications.  PCP: Boneta Lucks, NP at [facility]   Patient Active Problem List   Diagnosis Date Noted  . Ureteral calculus, right 03/16/2013   Past Medical History  Diagnosis Date  . ADHD (attention deficit hyperactivity disorder)   . Anxiety disorder   . Right ureteral stone   . Hematuria   . Frequency of urination   . History  of kidney stones    Past Surgical History  Procedure Laterality Date  . Wisdom tooth extraction    . Cystoscopy with ureteroscopy and stent placement Right 03/17/2013    Procedure: CYSTOSCOPY WITH RIGHT URETEROSCOPY HOLMIUM LASER OF STONESER LITHOTRIPSY AND STENT PLACEMENT;  Surgeon: Garnett Farm, MD;  Location: [facility];  Service: Urology;  Laterality: Right;   Allergies  Allergen Reactions  . Nickel Rash   Prior to Admission medications   Medication Sig Start Date End Date Taking? Authorizing Provider  ALPRAZolam (XANAX XR) 1 MG 24 hr tablet Take 1 mg  by mouth every morning.   Yes Historical Provider, MD  ALPRAZolam Prudy Feeler) 1 MG tablet Take 1 mg by mouth 3 (three) times daily as needed for anxiety.   Yes Historical Provider, MD  amphetamine-dextroamphetamine (ADDERALL XR) 15 MG 24 hr capsule Take 15 mg by mouth every morning.   Yes Historical Provider, MD  JUNEL FE 1/20 1-20 MG-MCG tablet  01/13/14  Yes Historical Provider, MD  valACYclovir (VALTREX) 1000 MG tablet Take 1,000 mg by mouth daily as needed (outbreak).    Yes Historical Provider, MD   History   Social History  . Marital Status: Single    Spouse Name: N/A    Number of Children: N/A  . Years of Education: N/A   Occupational History  . Not on file.   Social History Main Topics  . Smoking status: Former Smoker    Types: Cigarettes    Quit date: 12/31/2013  . Smokeless tobacco: Never Used     Comment: occasional smoker of cigarettes and e-cig since 2012  . Alcohol Use: Yes     Comment: occasional  . Drug Use: No  . Sexual Activity: Not on file   Other Topics Concern  . Not on file   Social History Narrative      Review of Systems  Gastrointestinal: Positive for nausea and vomiting.  Genitourinary: Positive for dysuria, urgency, frequency and flank pain.       Objective:   Physical Exam  Constitutional: She is oriented to person, place, and time. She appears well-developed and well-nourished.  HENT:  Head: Normocephalic and atraumatic.  Eyes: Conjunctivae are normal.  Neck: Normal range of motion. Neck supple.  Pulmonary/Chest: Effort normal.  Abdominal: Soft. Bowel sounds are normal. She exhibits no distension. There is no tenderness. There is no rebound, no guarding and no CVA tenderness.  Genitourinary:  Pain in lower lumbar area but not tender on exam  Musculoskeletal: Normal range of motion.  Neurological: She is alert and oriented to person, place, and time.  Skin: Skin is warm and dry.  Psychiatric: She has a normal mood and affect.  Nursing  note and vitals reviewed.   Filed Vitals:   06/03/14 1849  BP: 134/98  Pulse: 91  Temp: 97.8 F (36.6 C)  TempSrc: Oral  Resp: 20  Height: 5\' 10"  (1.778 m)  Weight: 204 lb 9.6 oz (92.806 kg)  SpO2: 100%   Controlled substance reporting system report: last prescription for Oxycodone 5mg  #20 on November 6, which correlates with her history of a back strain at that time. Prior to that, #20 given in September from the ED.   Results for orders placed or performed in visit on 06/03/14  POCT UA - Microscopic Only  Result Value Ref Range   WBC, Ur, HPF, POC 8-12    RBC, urine, microscopic 4-8    Bacteria, U Microscopic 1+    Mucus,  UA neg    Epithelial cells, urine per micros 3-6    Crystals, Ur, HPF, POC calcium oxalate    Casts, Ur, LPF, POC neg    Yeast, UA neg   POCT urinalysis dipstick  Result Value Ref Range   Color, UA yellow    Clarity, UA hazy    Glucose, UA neg    Bilirubin, UA neg    Ketones, UA neg    Spec Grav, UA 1.025    Blood, UA moderate    pH, UA 5.5    Protein, UA neg    Urobilinogen, UA 0.2    Nitrite, UA neg    Leukocytes, UA Trace    [facility] (PRIMARY) x-ray report read by Dr. Neva SeatGreene, abdomen 1 view: no apparent nephrolith visualized.      Assessment & Plan:   Emily Silva is a 23 y.o. female Dysuria - Plan: POCT UA - Microscopic Only, POCT urinalysis dipstick, DG Abd 1 View  Flank pain - Plan: POCT UA - Microscopic Only, POCT urinalysis dipstick, DG Abd 1 View, HYDROcodone-acetaminophen (NORCO/VICODIN) 5-325 MG per tablet  History of kidney stones - Plan: POCT UA - Microscopic Only, POCT urinalysis dipstick, DG Abd 1 View, HYDROcodone-acetaminophen (NORCO/VICODIN) 5-325 MG per tablet  Nephrolithiasis - Plan: ciprofloxacin (CIPRO) 500 MG tablet, HYDROcodone-acetaminophen (NORCO/VICODIN) 5-325 MG per tablet  Acute hemorrhagic cystitis - Plan: ciprofloxacin (CIPRO) 500 MG tablet, Urine culture   - suspected repeat nephrolith by history and lab  results in office. Will cover for possible secondary infection/cystitis with Cipro for now - urine culture sent, and hydrocodone if needed for pain. Plan to follow up with urologist in next week.  If not improving in next 2-3 days, may need CT - RTC or if can be seen by urologist - may be able to have this done there. ER/RTC precautions.    Filter provided for pickup for her to try to catch stone if passed.    Meds ordered this encounter  Medications  . ciprofloxacin (CIPRO) 500 MG tablet    Sig: Take 1 tablet (500 mg total) by mouth 2 (two) times daily.    Dispense:  20 tablet    Refill:  0  . HYDROcodone-acetaminophen (NORCO/VICODIN) 5-325 MG per tablet    Sig: Take 1 tablet by mouth every 6 (six) hours as needed for moderate pain.    Dispense:  20 tablet    Refill:  0   Patient Instructions  Start Cipro for possible infection, hydrocodone if needed for pain, and follow up with urologist [facility](FR[facility] or here if not improved in 2 days, or if improving - follow up with urologist next week). You should receive a call or letter about your lab results within the next week to 10 days.  Return to the clinic or go to the nearest emergency room if any of your symptoms worsen or new symptoms occur.    Kidney Stones Kidney stones (urolithiasis) are deposits that form inside your kidneys. The intense pain is caused by the stone moving through the urinary tract. When the stone moves, the ureter goes into spasm around the stone. The stone is usually passed in the urine.  CAUSES   A disorder that makes certain neck glands produce too much parathyroid hormone (primary hyperparathyroidism).  A buildup of uric acid crystals, similar to gout in your joints.  Narrowing (stricture) of the ureter.  A kidney obstruction present at birth (congenital obstruction).  Previous surgery on the kidney or ureters.  Numerous  kidney infections. SYMPTOMS   Feeling sick to your stomach (nauseous).  Throwing up  (vomiting).  Blood in the urine (hematuria).  Pain that usually spreads (radiates) to the groin.  Frequency or urgency of urination. DIAGNOSIS   Taking a history and physical exam.  Blood or urine tests.  CT scan.  Occasionally, an examination of the inside of the urinary bladder (cystoscopy) is performed. TREATMENT   Observation.  Increasing your fluid intake.  Extracorporeal shock wave lithotripsy--This is a noninvasive procedure that uses shock waves to break up kidney stones.  Surgery may be needed if you have severe pain or persistent obstruction. There are various surgical procedures. Most of the procedures are performed with the use of small instruments. Only small incisions are needed to accommodate these instruments, so recovery time is minimized. The size, location, and chemical composition are all important variables that will determine the proper choice of action for you. Talk to your health care provider to better understand your situation so that you will minimize the risk of injury to yourself and your kidney.  HOME CARE INSTRUCTIONS   Drink enough water and fluids to keep your urine clear or pale yellow. This will help you to pass the stone or stone fragments.  Strain all urine through the provided strainer. Keep all particulate matter and stones for your health care provider to see. The stone causing the pain may be as small as a grain of salt. It is very important to use the strainer each and every time you pass your urine. The collection of your stone will allow your health care provider to analyze it and verify that a stone has actually passed. The stone analysis will often identify what you can do to reduce the incidence of recurrences.  Only take over-the-counter or prescription medicines for pain, discomfort, or fever as directed by your health care provider.  Make a follow-up appointment with your health care provider as directed.  Get follow-up X-rays if  required. The absence of pain does not always mean that the stone has passed. It may have only stopped moving. If the urine remains completely obstructed, it can cause loss of kidney function or even complete destruction of the kidney. It is your responsibility to make sure X-rays and follow-ups are completed. Ultrasounds of the kidney can show blockages and the status of the kidney. Ultrasounds are not associated with any radiation and can be performed easily in a matter of minutes. SEEK MEDICAL CARE IF:  You experience pain that is progressive and unresponsive to any pain medicine you have been prescribed. SEEK IMMEDIATE MEDICAL CARE IF:   Pain cannot be controlled with the prescribed medicine.  You have a fever or shaking chills.  The severity or intensity of pain increases over 18 hours and is not relieved by pain medicine.  You develop a new onset of abdominal pain.  You feel faint or pass out.  You are unable to urinate. MAKE SURE YOU:   Understand these instructions.  Will watch your condition.  Will get help right away if you are not doing well or get worse. Document Released: 06/19/2005 Document Revised: 02/19/2013 Document Reviewed: 11/20/2012 [facility] Patient Information 2015 [place]. This information is not intended to replace advice given to you by your health care provider. Make sure you discuss any questions you have with your health care provider. Urinary Tract Infection Urinary tract infections (UTIs) can develop anywhere along your urinary tract. Your urinary tract is your body's drainage system  for removing wastes and extra water. Your urinary tract includes two kidneys, two ureters, a bladder, and a urethra. Your kidneys are a pair of bean-shaped organs. Each kidney is about the size of your fist. They are located below your ribs, one on each side of your spine. CAUSES Infections are caused by microbes, which are microscopic organisms, including fungi,  viruses, and bacteria. These organisms are so small that they can only be seen through a microscope. Bacteria are the microbes that most commonly cause UTIs. SYMPTOMS  Symptoms of UTIs may vary by age and gender of the patient and by the location of the infection. Symptoms in young women typically include a frequent and intense urge to urinate and a painful, burning feeling in the bladder or urethra during urination. Older women and men are more likely to be tired, shaky, and weak and have muscle aches and abdominal pain. A fever may mean the infection is in your kidneys. Other symptoms of a kidney infection include pain in your back or sides below the ribs, nausea, and vomiting. DIAGNOSIS To diagnose a UTI, your caregiver will ask you about your symptoms. Your caregiver also will ask to provide a urine sample. The urine sample will be tested for bacteria and white blood cells. White blood cells are made by your body to help fight infection. TREATMENT  Typically, UTIs can be treated with medication. Because most UTIs are caused by a bacterial infection, they usually can be treated with the use of antibiotics. The choice of antibiotic and length of treatment depend on your symptoms and the type of bacteria causing your infection. HOME CARE INSTRUCTIONS  If you were prescribed antibiotics, take them exactly as your caregiver instructs you. Finish the medication even if you feel better after you have only taken some of the medication.  Drink enough water and fluids to keep your urine clear or pale yellow.  Avoid caffeine, tea, and carbonated beverages. They tend to irritate your bladder.  Empty your bladder often. Avoid holding urine for long periods of time.  Empty your bladder before and after sexual intercourse.  After a bowel movement, women should cleanse from front to back. Use each tissue only once. SEEK MEDICAL CARE IF:   You have back pain.  You develop a fever.  Your symptoms do not  begin to resolve within 3 days. SEEK IMMEDIATE MEDICAL CARE IF:   You have severe back pain or lower abdominal pain.  You develop chills.  You have nausea or vomiting.  You have continued burning or discomfort with urination. MAKE SURE YOU:   Understand these instructions.  Will watch your condition.  Will get help right away if you are not doing well or get worse. Document Released: 03/29/2005 Document Revised: 12/19/2011 Document Reviewed: 07/28/2011 [facility] Patient Information 2015 [place], [place]. This information is not intended to replace advice given to you by your health care provider. Make sure you discuss any questions you have with your health care provider.     I personally performed the services described in this documentation, which was scribed in my presence. The recorded information has been reviewed and considered, and addended by me as needed.

## 2014-06-05 LAB — URINE CULTURE

## 2014-07-09 ENCOUNTER — Encounter (HOSPITAL_COMMUNITY): Payer: Self-pay | Admitting: Emergency Medicine

## 2014-07-09 ENCOUNTER — Emergency Department (HOSPITAL_COMMUNITY)
Admission: EM | Admit: 2014-07-09 | Discharge: 2014-07-09 | Disposition: A | Discharge status: Home / Self Care | Payer: Managed Care, Other (non HMO) | Attending: Emergency Medicine | Admitting: Emergency Medicine

## 2014-07-09 DIAGNOSIS — Z87891 Personal history of nicotine dependence: Secondary | ICD-10-CM | POA: Insufficient documentation

## 2014-07-09 DIAGNOSIS — R350 Frequency of micturition: Secondary | ICD-10-CM | POA: Diagnosis present

## 2014-07-09 DIAGNOSIS — F909 Attention-deficit hyperactivity disorder, unspecified type: Secondary | ICD-10-CM | POA: Insufficient documentation

## 2014-07-09 DIAGNOSIS — Z3202 Encounter for pregnancy test, result negative: Secondary | ICD-10-CM | POA: Diagnosis not present

## 2014-07-09 DIAGNOSIS — R109 Unspecified abdominal pain: Secondary | ICD-10-CM

## 2014-07-09 DIAGNOSIS — N200 Calculus of kidney: Secondary | ICD-10-CM

## 2014-07-09 DIAGNOSIS — Z79899 Other long term (current) drug therapy: Secondary | ICD-10-CM | POA: Diagnosis not present

## 2014-07-09 DIAGNOSIS — F419 Anxiety disorder, unspecified: Secondary | ICD-10-CM | POA: Diagnosis not present

## 2014-07-09 HISTORY — DX: Disorder of kidney and ureter, unspecified: N28.9

## 2014-07-09 LAB — URINALYSIS, ROUTINE W REFLEX MICROSCOPIC
Bilirubin Urine: NEGATIVE
Glucose, UA: NEGATIVE mg/dL
Ketones, ur: NEGATIVE mg/dL
Leukocytes, UA: NEGATIVE
Nitrite: NEGATIVE
Protein, ur: NEGATIVE mg/dL
Specific Gravity, Urine: 1.023 (ref 1.005–1.030)
Urobilinogen, UA: 0.2 mg/dL (ref 0.0–1.0)
pH: 6 (ref 5.0–8.0)

## 2014-07-09 LAB — CBC WITH DIFFERENTIAL/PLATELET
Basophils Absolute: 0 10*3/uL (ref 0.0–0.1)
Basophils Relative: 0 % (ref 0–1)
Eosinophils Absolute: 0.3 10*3/uL (ref 0.0–0.7)
Eosinophils Relative: 3 % (ref 0–5)
HCT: 39.6 % (ref 36.0–46.0)
Hemoglobin: 12.8 g/dL (ref 12.0–15.0)
Lymphocytes Relative: 36 % (ref 12–46)
Lymphs Abs: 3.4 10*3/uL (ref 0.7–4.0)
MCH: 27.5 pg (ref 26.0–34.0)
MCHC: 32.3 g/dL (ref 30.0–36.0)
MCV: 85.2 fL (ref 78.0–100.0)
Monocytes Absolute: 0.7 10*3/uL (ref 0.1–1.0)
Monocytes Relative: 8 % (ref 3–12)
Neutro Abs: 5 10*3/uL (ref 1.7–7.7)
Neutrophils Relative %: 53 % (ref 43–77)
Platelets: 302 10*3/uL (ref 150–400)
RBC: 4.65 MIL/uL (ref 3.87–5.11)
RDW: 12.9 % (ref 11.5–15.5)
WBC: 9.3 10*3/uL (ref 4.0–10.5)

## 2014-07-09 LAB — BASIC METABOLIC PANEL
Anion gap: 6 (ref 5–15)
BUN: 7 mg/dL (ref 6–23)
CO2: 26 mmol/L (ref 19–32)
Calcium: 9.3 mg/dL (ref 8.4–10.5)
Chloride: 105 mEq/L (ref 96–112)
Creatinine, Ser: 0.77 mg/dL (ref 0.50–1.10)
GFR calc Af Amer: >90 mL/min (ref >90)
GFR calc non Af Amer: >90 mL/min (ref >90)
Glucose, Bld: 121 mg/dL — ABNORMAL HIGH (ref 70–99)
Potassium: 3.6 mmol/L (ref 3.5–5.1)
Sodium: 137 mmol/L (ref 135–145)

## 2014-07-09 LAB — URINE MICROSCOPIC-ADD ON

## 2014-07-09 LAB — PREGNANCY, URINE: Preg Test, Ur: NEGATIVE

## 2014-07-09 MED ORDER — MORPHINE SULFATE 4 MG/ML IJ SOLN
4.0000 mg | Freq: Once | INTRAMUSCULAR | Status: AC
  Administered 2014-07-09: 4 mg via INTRAVENOUS
  Filled 2014-07-09: qty 1

## 2014-07-09 MED ORDER — SODIUM CHLORIDE 0.9 % IV BOLUS (SEPSIS)
1000.0000 mL | Freq: Once | INTRAVENOUS | Status: AC
  Administered 2014-07-09: 1000 mL via INTRAVENOUS

## 2014-07-09 MED ORDER — ONDANSETRON HCL 4 MG/2ML IJ SOLN
4.0000 mg | Freq: Once | INTRAMUSCULAR | Status: AC
  Administered 2014-07-09: 4 mg via INTRAVENOUS
  Filled 2014-07-09: qty 2

## 2014-07-09 MED ORDER — KETOROLAC TROMETHAMINE 30 MG/ML IJ SOLN
30.0000 mg | Freq: Once | INTRAMUSCULAR | Status: AC
  Administered 2014-07-09: 30 mg via INTRAVENOUS
  Filled 2014-07-09: qty 1

## 2014-07-09 MED ORDER — OXYCODONE-ACETAMINOPHEN 5-325 MG PO TABS: 1.0000 | ORAL_TABLET | ORAL | Status: DC | PRN

## 2014-07-09 MED ORDER — ONDANSETRON 4 MG PO TBDP: 4.0000 mg | ORAL_TABLET | Freq: Three times a day (TID) | ORAL | Status: DC | PRN

## 2014-07-09 NOTE — ED Notes (Signed)
MD at bedside. 

## 2014-07-09 NOTE — ED Notes (Signed)
PT given water, MD states okay.

## 2014-07-09 NOTE — ED Notes (Signed)
Patient with increased need for urination in the last few hours with left side pain.  Patient does have some nausea and vomiting with the pain.  Patient is CAOX3.

## 2014-07-09 NOTE — ED Provider Notes (Signed)
CSN: [id number]     Arrival date & time 07/09/14  0537 History   First MD Initiated Contact with Patient 07/09/14 [id number]     Chief Complaint  Patient presents with  . Urinary Frequency  . Flank Pain     (Consider location/radiation/quality/duration/timing/severity/associated sxs/prior Treatment) HPI Comments: Patient is a 24 year old female past medical history significant for recurrent kidney stones presenting to the emergency department for acute onset left-sided flank pain that began last evening. She describes it as waxing and waning. She states she is having associated hematuria, dysuria, urinary frequency and urgency. She endorses nausea with 4 episodes of nonbloody emesis. No modifying factors identified. She states it feels previous kidney stones. She states she is followed by [facility].   Patient is a 24 y.o. female presenting with frequency and flank pain.  Urinary Frequency Associated symptoms include nausea and vomiting. Pertinent negatives include no chills or fever.  Flank Pain Associated symptoms include nausea and vomiting. Pertinent negatives include no chills or fever.    Past Medical History  Diagnosis Date  . ADHD (attention deficit hyperactivity disorder)   . Anxiety disorder   . Right ureteral stone   . Hematuria   . Frequency of urination   . History of kidney stones   . Renal disorder     Kidney stones   Past Surgical History  Procedure Laterality Date  . Wisdom tooth extraction    . Cystoscopy with ureteroscopy and stent placement Right 03/17/2013    Procedure: CYSTOSCOPY WITH RIGHT URETEROSCOPY HOLMIUM LASER OF STONESER LITHOTRIPSY AND STENT PLACEMENT;  Surgeon: Garnett FarmMark C Ottelin, MD;  [facility];  Service: Urology;  Laterality: Right;   Family History  Problem Relation Age of Onset  . Cancer Father    History  Substance Use Topics  . Smoking status: Former Smoker    Types: Cigarettes    Quit date: 12/31/2013  .  Smokeless tobacco: Never Used     Comment: occasional smoker of cigarettes and e-cig since 2012  . Alcohol Use: No     Comment: occasional   OB History    No data available     Review of Systems  Constitutional: Negative for fever and chills.  Gastrointestinal: Positive for nausea and vomiting.  Genitourinary: Positive for urgency, frequency, hematuria and flank pain.  All other systems reviewed and are negative.     Allergies  Nickel  Home Medications   Prior to Admission medications   Medication Sig Start Date End Date Taking? Authorizing Provider  ALPRAZolam (XANAX XR) 1 MG 24 hr tablet Take 1 mg by mouth every morning.   Yes Historical Provider, MD  ALPRAZolam Prudy Feeler(XANAX) 1 MG tablet Take 1 mg by mouth 3 (three) times daily as needed for anxiety.   Yes Historical Provider, MD  amphetamine-dextroamphetamine (ADDERALL XR) 15 MG 24 hr capsule Take 15 mg by mouth every morning.   Yes Historical Provider, MD  ciprofloxacin (CIPRO) 500 MG tablet Take 1 tablet (500 mg total) by mouth 2 (two) times daily. Patient not taking: Reported on 07/09/2014 06/03/14   Shade FloodJeffrey R Greene, MD  HYDROcodone-acetaminophen (NORCO/VICODIN) 5-325 MG per tablet Take 1 tablet by mouth every 6 (six) hours as needed for moderate pain. Patient not taking: Reported on 07/09/2014 06/03/14   Shade FloodJeffrey R Greene, MD  JUNEL FE 1/20 1-20 MG-MCG tablet  01/13/14   Historical Provider, MD  valACYclovir (VALTREX) 1000 MG tablet Take 1,000 mg by mouth daily as needed (outbreak).  Historical Provider, MD   BP 125/83 mmHg  Pulse 72  Temp(Src) 97.7 F (36.5 C) (Oral)  Resp 20  Ht  (1.778 m)  Wt 205 lb (92.987 kg)  BMI 29.41 kg/m2  SpO2 100%  LMP 06/28/2014 (Approximate) Physical Exam  Constitutional: She is oriented to person, place, and time. She appears well-developed and well-nourished. No distress.  HENT:  Head: Normocephalic and atraumatic.  Right Ear: External ear normal.  Left Ear: External ear normal.    Nose: Nose normal.  Mouth/Throat: Oropharynx is clear and moist.  Eyes: Conjunctivae are normal.  Neck: Normal range of motion. Neck supple.  Cardiovascular: Normal rate, regular rhythm and normal heart sounds.   Pulmonary/Chest: Effort normal and breath sounds normal.  Abdominal: Soft. Normal appearance and bowel sounds are normal. There is no tenderness. There is no rigidity, no rebound, no guarding and no CVA tenderness.  Musculoskeletal: Normal range of motion.       Back:  Neurological: She is alert and oriented to person, place, and time.  Skin: Skin is warm and dry. She is not diaphoretic.  Psychiatric: She has a normal mood and affect.  Nursing note and vitals reviewed.   ED Course  Procedures (including critical care time) Medications  sodium chloride 0.9 % bolus 1,000 mL (1,000 mLs Intravenous New Bag/Given 07/09/14 0638)  ondansetron (ZOFRAN) injection 4 mg (4 mg Intravenous Given 07/09/14 9604)  morphine 4 MG/ML injection 4 mg (4 mg Intravenous Given 07/09/14 5409)    Labs Review Labs Reviewed  URINALYSIS, ROUTINE W REFLEX MICROSCOPIC - Abnormal; Notable for the following:    APPearance CLOUDY (*)    Hgb urine dipstick MODERATE (*)    All other components within normal limits  BASIC METABOLIC PANEL - Abnormal; Notable for the following:    Glucose, Bld 121 (*)    All other components within normal limits  URINE MICROSCOPIC-ADD ON - Abnormal; Notable for the following:    Squamous Epithelial / LPF MANY (*)    Bacteria, UA FEW (*)    Crystals CA OXALATE CRYSTALS (*)    All other components within normal limits  URINE CULTURE  PREGNANCY, URINE  CBC WITH DIFFERENTIAL    Imaging Review No results found.   EKG Interpretation None      7:32 AM Patient declined the CT scan, she states she can follow up with her urologist in a timely fashion for re-evaluation.   MDM   Final diagnoses:  Left flank pain   Filed Vitals:   07/09/14 0715  BP: 125/83  Pulse: 72   Temp:   Resp:    Afebrile, NAD, non-toxic appearing, AAOx4.  I have reviewed nursing notes, vital signs, and all appropriate lab and imaging results for this patient. Pt likely with recurrent kidney stone based on history, physical, and lab results. Serum creatine WNL, vitals sign stable and the pt does not have irratractable vomiting. Patient is able to follow up with [facility] as outpatient within the next few days. Pt will be dc home with pain medications & has been advised to follow up with urology. Return precautions discussed. Patient is agreeable to plan.  Patient is stable at time of discharge     Jeannetta Ellis, PA-C 07/09/14 1301  Tomasita Crumble, MD 07/09/14 [id number]

## 2014-07-09 NOTE — ED Notes (Signed)
Pt states " I feel like the stone is passing". " I rather wait to do a CT at my urology office. "

## 2014-07-09 NOTE — Discharge Instructions (Signed)
Please follow up with your primary care physician in 1-2 days. If you do not have one please call the [facility] number listed above. Please follow up with [facility] to schedule a follow up appointment.  Please take pain medication and/or muscle relaxants as prescribed and as needed for pain. Please do not drive on narcotic pain medication or on muscle relaxants. Please read all discharge instructions and return precautions.   Kidney Stones Kidney stones (urolithiasis) are deposits that form inside your kidneys. The intense pain is caused by the stone moving through the urinary tract. When the stone moves, the ureter goes into spasm around the stone. The stone is usually passed in the urine.  CAUSES   A disorder that makes certain neck glands produce too much parathyroid hormone (primary hyperparathyroidism).  A buildup of uric acid crystals, similar to gout in your joints.  Narrowing (stricture) of the ureter.  A kidney obstruction present at birth (congenital obstruction).  Previous surgery on the kidney or ureters.  Numerous kidney infections. SYMPTOMS   Feeling sick to your stomach (nauseous).  Throwing up (vomiting).  Blood in the urine (hematuria).  Pain that usually spreads (radiates) to the groin.  Frequency or urgency of urination. DIAGNOSIS   Taking a history and physical exam.  Blood or urine tests.  CT scan.  Occasionally, an examination of the inside of the urinary bladder (cystoscopy) is performed. TREATMENT   Observation.  Increasing your fluid intake.  Extracorporeal shock wave lithotripsy--This is a noninvasive procedure that uses shock waves to break up kidney stones.  Surgery may be needed if you have severe pain or persistent obstruction. There are various surgical procedures. Most of the procedures are performed with the use of small instruments. Only small incisions are needed to accommodate these instruments, so recovery  time is minimized. The size, location, and chemical composition are all important variables that will determine the proper choice of action for you. Talk to your health care provider to better understand your situation so that you will minimize the risk of injury to yourself and your kidney.  HOME CARE INSTRUCTIONS   Drink enough water and fluids to keep your urine clear or pale yellow. This will help you to pass the stone or stone fragments.  Strain all urine through the provided strainer. Keep all particulate matter and stones for your health care provider to see. The stone causing the pain may be as small as a grain of salt. It is very important to use the strainer each and every time you pass your urine. The collection of your stone will allow your health care provider to analyze it and verify that a stone has actually passed. The stone analysis will often identify what you can do to reduce the incidence of recurrences.  Only take over-the-counter or prescription medicines for pain, discomfort, or fever as directed by your health care provider.  Make a follow-up appointment with your health care provider as directed.  Get follow-up X-rays if required. The absence of pain does not always mean that the stone has passed. It may have only stopped moving. If the urine remains completely obstructed, it can cause loss of kidney function or even complete destruction of the kidney. It is your responsibility to make sure X-rays and follow-ups are completed. Ultrasounds of the kidney can show blockages and the status of the kidney. Ultrasounds are not associated with any radiation and can be performed easily in a matter of minutes. SEEK  MEDICAL CARE IF:  You experience pain that is progressive and unresponsive to any pain medicine you have been prescribed. SEEK IMMEDIATE MEDICAL CARE IF:   Pain cannot be controlled with the prescribed medicine.  You have a fever or shaking chills.  The severity or  intensity of pain increases over 18 hours and is not relieved by pain medicine.  You develop a new onset of abdominal pain.  You feel faint or pass out.  You are unable to urinate. MAKE SURE YOU:   Understand these instructions.  Will watch your condition.  Will get help right away if you are not doing well or get worse. Document Released: 06/19/2005 Document Revised: 02/19/2013 Document Reviewed: 11/20/2012 [facility] Patient Information 2015 [place], [place]. This information is not intended to replace advice given to you by your health care provider. Make sure you discuss any questions you have with your health care provider.

## 2014-07-10 LAB — URINE CULTURE: Colony Count: 75000

## 2014-07-12 ENCOUNTER — Emergency Department: Payer: Self-pay | Admitting: Emergency Medicine

## 2014-07-12 LAB — URINALYSIS, COMPLETE
Bacteria: NONE SEEN
Bilirubin,UR: NEGATIVE
Glucose,UR: NEGATIVE mg/dL (ref 0–75)
Nitrite: NEGATIVE
Ph: 6 (ref 4.5–8.0)
Protein: NEGATIVE
RBC,UR: <1 /HPF (ref 0–5)
Specific Gravity: 1.011 (ref 1.003–1.030)
Squamous Epithelial: 4
WBC UR: 10 /HPF (ref 0–5)

## 2014-07-12 LAB — COMPREHENSIVE METABOLIC PANEL
Albumin: 4.7 g/dL (ref 3.4–5.0)
Alkaline Phosphatase: 131 U/L — ABNORMAL HIGH
Anion Gap: 8 (ref 7–16)
BUN: 10 mg/dL (ref 7–18)
Bilirubin,Total: 0.3 mg/dL (ref 0.2–1.0)
Calcium, Total: 10 mg/dL (ref 8.5–10.1)
Chloride: 102 mmol/L (ref 98–107)
Co2: 28 mmol/L (ref 21–32)
Creatinine: 1 mg/dL (ref 0.60–1.30)
EGFR (African American): >60
EGFR (Non-African Amer.): >60
Glucose: 101 mg/dL — ABNORMAL HIGH (ref 65–99)
Osmolality: 275 (ref 275–301)
Potassium: 3.9 mmol/L (ref 3.5–5.1)
SGOT(AST): 31 U/L (ref 15–37)
SGPT (ALT): 43 U/L
Sodium: 138 mmol/L (ref 136–145)
Total Protein: 9.7 g/dL — ABNORMAL HIGH (ref 6.4–8.2)

## 2014-07-12 LAB — CBC
HCT: 47.2 % — ABNORMAL HIGH (ref 35.0–47.0)
HGB: 15.2 g/dL (ref 12.0–16.0)
MCH: 27.5 pg (ref 26.0–34.0)
MCHC: 32.1 g/dL (ref 32.0–36.0)
MCV: 86 fL (ref 80–100)
Platelet: 408 10*3/uL (ref 150–440)
RBC: 5.51 10*6/uL — ABNORMAL HIGH (ref 3.80–5.20)
RDW: 13.3 % (ref 11.5–14.5)
WBC: 17.6 10*3/uL — ABNORMAL HIGH (ref 3.6–11.0)

## 2014-10-25 ENCOUNTER — Emergency Department (HOSPITAL_COMMUNITY)
Admission: EM | Admit: 2014-10-25 | Discharge: 2014-10-26 | Disposition: A | Discharge status: Home / Self Care | Payer: Managed Care, Other (non HMO) | Attending: Emergency Medicine | Admitting: Emergency Medicine

## 2014-10-25 ENCOUNTER — Encounter (HOSPITAL_COMMUNITY): Payer: Self-pay | Admitting: *Deleted

## 2014-10-25 DIAGNOSIS — Z3202 Encounter for pregnancy test, result negative: Secondary | ICD-10-CM | POA: Insufficient documentation

## 2014-10-25 DIAGNOSIS — N23 Unspecified renal colic: Secondary | ICD-10-CM

## 2014-10-25 DIAGNOSIS — F419 Anxiety disorder, unspecified: Secondary | ICD-10-CM | POA: Insufficient documentation

## 2014-10-25 DIAGNOSIS — Z79899 Other long term (current) drug therapy: Secondary | ICD-10-CM | POA: Insufficient documentation

## 2014-10-25 DIAGNOSIS — Z87891 Personal history of nicotine dependence: Secondary | ICD-10-CM | POA: Insufficient documentation

## 2014-10-25 DIAGNOSIS — F909 Attention-deficit hyperactivity disorder, unspecified type: Secondary | ICD-10-CM | POA: Diagnosis not present

## 2014-10-25 DIAGNOSIS — R109 Unspecified abdominal pain: Secondary | ICD-10-CM

## 2014-10-25 LAB — CBC WITH DIFFERENTIAL/PLATELET
Basophils Absolute: 0 10*3/uL (ref 0.0–0.1)
Basophils Relative: 0 % (ref 0–1)
Eosinophils Absolute: 0.1 10*3/uL (ref 0.0–0.7)
Eosinophils Relative: 0 % (ref 0–5)
HCT: 42 % (ref 36.0–46.0)
Hemoglobin: 14.2 g/dL (ref 12.0–15.0)
Lymphocytes Relative: 13 % (ref 12–46)
Lymphs Abs: 1.7 10*3/uL (ref 0.7–4.0)
MCH: 28.5 pg (ref 26.0–34.0)
MCHC: 33.8 g/dL (ref 30.0–36.0)
MCV: 84.3 fL (ref 78.0–100.0)
Monocytes Absolute: 0.3 10*3/uL (ref 0.1–1.0)
Monocytes Relative: 2 % — ABNORMAL LOW (ref 3–12)
Neutro Abs: 11.4 10*3/uL — ABNORMAL HIGH (ref 1.7–7.7)
Neutrophils Relative %: 85 % — ABNORMAL HIGH (ref 43–77)
Platelets: 382 10*3/uL (ref 150–400)
RBC: 4.98 MIL/uL (ref 3.87–5.11)
RDW: 13.5 % (ref 11.5–15.5)
WBC: 13.5 10*3/uL — ABNORMAL HIGH (ref 4.0–10.5)

## 2014-10-25 LAB — URINALYSIS, ROUTINE W REFLEX MICROSCOPIC
Bilirubin Urine: NEGATIVE
Glucose, UA: NEGATIVE mg/dL
Ketones, ur: 40 mg/dL — AB
Leukocytes, UA: NEGATIVE
Nitrite: NEGATIVE
Protein, ur: NEGATIVE mg/dL
Specific Gravity, Urine: 1.02 (ref 1.005–1.030)
Urobilinogen, UA: 0.2 mg/dL (ref 0.0–1.0)
pH: 6.5 (ref 5.0–8.0)

## 2014-10-25 LAB — COMPREHENSIVE METABOLIC PANEL
ALT: 24 U/L (ref 0–35)
AST: 21 U/L (ref 0–37)
Albumin: 3.9 g/dL (ref 3.5–5.2)
Alkaline Phosphatase: 113 U/L (ref 39–117)
Anion gap: 11 (ref 5–15)
BUN: 6 mg/dL (ref 6–23)
CO2: 24 mmol/L (ref 19–32)
Calcium: 9.6 mg/dL (ref 8.4–10.5)
Chloride: 102 mmol/L (ref 96–112)
Creatinine, Ser: 0.71 mg/dL (ref 0.50–1.10)
GFR calc Af Amer: >90 mL/min (ref >90)
GFR calc non Af Amer: >90 mL/min (ref >90)
Glucose, Bld: 115 mg/dL — ABNORMAL HIGH (ref 70–99)
Potassium: 3.9 mmol/L (ref 3.5–5.1)
Sodium: 137 mmol/L (ref 135–145)
Total Bilirubin: 0.7 mg/dL (ref 0.3–1.2)
Total Protein: 7.7 g/dL (ref 6.0–8.3)

## 2014-10-25 LAB — I-STAT TROPONIN, ED: Troponin i, poc: 0 ng/mL (ref 0.00–0.08)

## 2014-10-25 LAB — URINE MICROSCOPIC-ADD ON

## 2014-10-25 LAB — LIPASE, BLOOD: Lipase: 22 U/L (ref 11–59)

## 2014-10-25 NOTE — ED Notes (Signed)
Pt c/o left flank pain since today at 1400. States that she is unable to keep anything down. Reports that the pain travels to her back.  States a hx of kidney stones.

## 2014-10-25 NOTE — ED Provider Notes (Signed)
CSN: [id number]     Arrival date & time 10/25/14  2202 History   First MD Initiated Contact with Patient 10/25/14 2352     This chart was scribed for Emily Raceravid Madoc Holquin, MD by Arlan OrganAshley Leger, ED Scribe. This patient was seen in room A01C/A01C and the patient's care was started 12:08 AM.   Chief Complaint  Patient presents with  . Flank Pain   HPI  HPI Comments: Emily Silva is a 24 y.o. female with a PMHx of ADHD, anxiety, kidney stones who presents to the Emergency Department complaining of constant, ongoing, gradually worsening L sided flank pain onset 2 PM this afternoon. Pain is described as sharp. Pt states she attempted to take her prescribed Sebutex earlier today but was unable to keep water or tablet down. Pt also reports nausea and vomiting. Last normal bowel movement this morning. No recent dysuria, fever, chills, or hematuria. Last CT scan performed several months ago. No known allergies to medications.  Past Medical History  Diagnosis Date  . ADHD (attention deficit hyperactivity disorder)   . Anxiety disorder   . Right ureteral stone   . Hematuria   . Frequency of urination   . History of kidney stones   . Renal disorder     Kidney stones   Past Surgical History  Procedure Laterality Date  . Wisdom tooth extraction    . Cystoscopy with ureteroscopy and stent placement Right 03/17/2013    Procedure: CYSTOSCOPY WITH RIGHT URETEROSCOPY HOLMIUM LASER OF STONESER LITHOTRIPSY AND STENT PLACEMENT;  Surgeon: Garnett FarmMark C Ottelin, MD;  [facility];  Service: Urology;  Laterality: Right;   Family History  Problem Relation Age of Onset  . Cancer Father    History  Substance Use Topics  . Smoking status: Former Smoker    Types: Cigarettes    Quit date: 12/31/2013  . Smokeless tobacco: Never Used     Comment: occasional smoker of cigarettes and e-cig since 2012  . Alcohol Use: No     Comment: occasional   OB History    No data available     Review of  Systems  Constitutional: Negative for fever and chills.  Respiratory: Negative for shortness of breath.   Cardiovascular: Negative for chest pain.  Gastrointestinal: Positive for nausea and vomiting. Negative for abdominal pain and diarrhea.  Genitourinary: Positive for flank pain. Negative for dysuria, hematuria and pelvic pain.  Musculoskeletal: Positive for myalgias and back pain. Negative for neck pain and neck stiffness.  Skin: Negative for rash and wound.  Neurological: Negative for dizziness, weakness, light-headedness, numbness and headaches.  All other systems reviewed and are negative.     Allergies  Nickel  Home Medications   Prior to Admission medications   Medication Sig Start Date End Date Taking? Authorizing Provider  ALPRAZolam (XANAX XR) 1 MG 24 hr tablet Take 1 mg by mouth every morning.   Yes Historical Provider, MD  ALPRAZolam Prudy Feeler(XANAX) 1 MG tablet Take 1 mg by mouth 3 (three) times daily as needed for anxiety.   Yes Historical Provider, MD  amphetamine-dextroamphetamine (ADDERALL XR) 15 MG 24 hr capsule Take 15 mg by mouth every morning.   Yes Historical Provider, MD  buprenorphine (SUBUTEX) 8 MG SUBL SL tablet Place 8-12 mg under the tongue daily.  10/23/14  Yes Historical Provider, MD  LATUDA 20 MG TABS Take 20 mg by mouth daily.  10/22/14  Yes Historical Provider, MD  ciprofloxacin (CIPRO) 500 MG tablet Take 1 tablet (500  mg total) by mouth 2 (two) times daily. Patient not taking: Reported on 07/09/2014 06/03/14   Shade Flood, MD  HYDROcodone-acetaminophen (NORCO/VICODIN) 5-325 MG per tablet Take 1 tablet by mouth every 6 (six) hours as needed for moderate pain. Patient not taking: Reported on 07/09/2014 06/03/14   Shade Flood, MD  ketorolac (TORADOL) 10 MG tablet Take 1 tablet (10 mg total) by mouth every 6 (six) hours as needed. 10/26/14   Emily Racer, MD  ondansetron (ZOFRAN ODT) 4 MG disintegrating tablet  ODT q4 hours prn nausea/vomit 10/26/14   Emily Racer, MD  oxyCODONE-acetaminophen (PERCOCET) 5-325 MG per tablet Take 1-2 tablets by mouth every 4 (four) hours as needed. 10/26/14   Emily Racer, MD  tamsulosin (FLOMAX) 0.4 MG CAPS capsule Take 1 capsule (0.4 mg total) by mouth daily. 10/26/14   Emily Racer, MD  valACYclovir (VALTREX) 1000 MG tablet Take 1,000 mg by mouth daily as needed (outbreak).     Historical Provider, MD   Triage Vitals: BP 117/71 mmHg  Pulse 72  Temp(Src) 98.2 F (36.8 C)  Resp 18  Ht  (1.803 m)  Wt 209 lb (94.802 kg)  BMI 29.16 kg/m2  SpO2 99%  LMP 10/07/2014   Physical Exam  Constitutional: She is oriented to person, place, and time. She appears well-developed and well-nourished. No distress.  Mild anxiety. In no apparent distress  HENT:  Head: Normocephalic and atraumatic.  Mouth/Throat: Oropharynx is clear and moist.  Eyes: EOM are normal. Pupils are equal, round, and reactive to light.  Neck: Normal range of motion. Neck supple.  Cardiovascular: Normal rate and regular rhythm.   Pulmonary/Chest: Effort normal and breath sounds normal. No respiratory distress. She has no wheezes. She has no rales. She exhibits no tenderness.  Abdominal: Soft. Bowel sounds are normal. She exhibits no distension and no mass. There is no tenderness. There is no rebound and no guarding.  Musculoskeletal: Normal range of motion. She exhibits tenderness. She exhibits no edema.  Patient has left-sided paraspinal lumbar tenderness. No definite CVA tenderness bilaterally. No midline thoracic or lumbar tenderness to palpation.  Neurological: She is alert and oriented to person, place, and time.  5/5 motor in all extremities. Sensation is full intact. Is ambulatory without difficulty  Skin: Skin is warm and dry. No rash noted. No erythema.  Psychiatric: Her behavior is normal.  Nursing note and vitals reviewed.   ED Course  Procedures (including critical care time)  DIAGNOSTIC STUDIES: Oxygen Saturation is  100% on RA, Normal by my interpretation.    COORDINATION OF CARE: 3:27 AM-Discussed treatment plan with pt at bedside and pt agreed to plan.     Labs Review Labs Reviewed  CBC WITH DIFFERENTIAL/PLATELET - Abnormal; Notable for the following:    WBC 13.5 (*)    Neutrophils Relative % 85 (*)    Neutro Abs 11.4 (*)    Monocytes Relative 2 (*)    All other components within normal limits  COMPREHENSIVE METABOLIC PANEL - Abnormal; Notable for the following:    Glucose, Bld 115 (*)    All other components within normal limits  URINALYSIS, ROUTINE W REFLEX MICROSCOPIC - Abnormal; Notable for the following:    Hgb urine dipstick SMALL (*)    Ketones, ur 40 (*)    All other components within normal limits  LIPASE, BLOOD  URINE MICROSCOPIC-ADD ON  I-STAT TROPOININ, ED  POC URINE PREG, ED    Imaging Review Ct Abdomen Pelvis Wo Contrast  10/26/2014   CLINICAL DATA:  Acute onset of left flank pain.  Initial encounter.  EXAM: CT ABDOMEN AND PELVIS WITHOUT CONTRAST  TECHNIQUE: Multidetector CT imaging of the abdomen and pelvis was performed following the standard protocol without IV contrast.  COMPARISON:  CT of the abdomen and pelvis performed 09/11/2013, and renal ultrasound performed 07/12/2014  FINDINGS: Minimal bibasilar atelectasis is noted.  The liver and spleen are unremarkable in appearance. The gallbladder is within normal limits. The pancreas and adrenal glands are unremarkable.  There is mild left-sided hydronephrosis, with stranding about the left renal pelvis, and an obstructing 4 mm stone noted in the proximal left ureter, approximately 4 cm below the left renal pelvis.  Scattered nonobstructing renal stones are noted bilaterally, with associated slightly more diffuse increased medullary attenuation. The kidneys are otherwise unremarkable.  No free fluid is identified. The small bowel is unremarkable in appearance. The stomach is within normal limits. No acute vascular abnormalities are  seen.  The appendix is normal in caliber, without evidence for appendicitis. The colon is unremarkable in appearance.  The bladder is mildly distended and grossly unremarkable in appearance. The uterus is unremarkable. The ovaries are relatively symmetric. No suspicious adnexal masses are seen. No inguinal lymphadenopathy is seen. A metallic piercing is noted at the labia.  No acute osseous abnormalities are identified.  IMPRESSION: 1. Mild left-sided hydronephrosis, with an obstructing 4 mm stone at the proximal left ureter, 4 cm below the left renal pelvis. 2. Scattered nonobstructing bilateral renal stones noted.   Electronically Signed   By: Roanna Raider M.D.   On: 10/26/2014 03:06     EKG Interpretation None      MDM   Final diagnoses:  Left flank pain  Renal colic on left side    I personally performed the services described in this documentation, which was scribed in my presence. The recorded information has been reviewed and is accurate.  Patient's pain is improved. We'll discharge home to follow-up with her urologist. Return precautions given.  Emily Racer, MD 10/26/14 [phone]

## 2014-10-26 ENCOUNTER — Emergency Department (HOSPITAL_COMMUNITY): Payer: Managed Care, Other (non HMO)

## 2014-10-26 LAB — POC URINE PREG, ED: Preg Test, Ur: NEGATIVE

## 2014-10-26 MED ORDER — HYDROMORPHONE HCL 1 MG/ML IJ SOLN
1.0000 mg | Freq: Once | INTRAMUSCULAR | Status: AC
  Administered 2014-10-26: 1 mg via INTRAVENOUS
  Filled 2014-10-26: qty 1

## 2014-10-26 MED ORDER — TAMSULOSIN HCL 0.4 MG PO CAPS: 0.4000 mg | ORAL_CAPSULE | Freq: Every day | ORAL | Status: DC

## 2014-10-26 MED ORDER — OXYCODONE-ACETAMINOPHEN 5-325 MG PO TABS: 1.0000 | ORAL_TABLET | ORAL | Status: DC | PRN

## 2014-10-26 MED ORDER — ONDANSETRON HCL 4 MG/2ML IJ SOLN
4.0000 mg | Freq: Once | INTRAMUSCULAR | Status: AC
  Administered 2014-10-26: 4 mg via INTRAVENOUS
  Filled 2014-10-26: qty 2

## 2014-10-26 MED ORDER — KETOROLAC TROMETHAMINE 10 MG PO TABS: 10.0000 mg | ORAL_TABLET | Freq: Four times a day (QID) | ORAL | Status: DC | PRN

## 2014-10-26 MED ORDER — SODIUM CHLORIDE 0.9 % IV BOLUS (SEPSIS)
1000.0000 mL | Freq: Once | INTRAVENOUS | Status: AC
  Administered 2014-10-26: 1000 mL via INTRAVENOUS

## 2014-10-26 MED ORDER — KETOROLAC TROMETHAMINE 30 MG/ML IJ SOLN
30.0000 mg | Freq: Once | INTRAMUSCULAR | Status: AC
  Administered 2014-10-26: 30 mg via INTRAVENOUS
  Filled 2014-10-26: qty 1

## 2014-10-26 MED ORDER — ONDANSETRON 4 MG PO TBDP
ORAL_TABLET | ORAL | Status: DC
Start: 2014-10-26 — End: 2015-03-15

## 2014-10-26 NOTE — ED Notes (Signed)
Patient transported to CT 

## 2014-10-26 NOTE — Discharge Instructions (Signed)

## 2014-10-26 NOTE — ED Notes (Signed)
Pharmacy at bedside, per pt request, to discuss medication options for pain relief, concerning pt's hx.

## 2014-10-28 ENCOUNTER — Telehealth (HOSPITAL_BASED_OUTPATIENT_CLINIC_OR_DEPARTMENT_OTHER): Payer: Self-pay | Admitting: Emergency Medicine

## 2015-03-08 ENCOUNTER — Emergency Department (HOSPITAL_COMMUNITY): Payer: Managed Care, Other (non HMO)

## 2015-03-08 ENCOUNTER — Emergency Department (HOSPITAL_COMMUNITY)
Admission: EM | Admit: 2015-03-08 | Discharge: 2015-03-08 | Disposition: A | Discharge status: Home / Self Care | Payer: Managed Care, Other (non HMO) | Attending: Emergency Medicine | Admitting: Emergency Medicine

## 2015-03-08 ENCOUNTER — Encounter (HOSPITAL_COMMUNITY): Payer: Self-pay | Admitting: Vascular Surgery

## 2015-03-08 DIAGNOSIS — R109 Unspecified abdominal pain: Secondary | ICD-10-CM

## 2015-03-08 DIAGNOSIS — Z87442 Personal history of urinary calculi: Secondary | ICD-10-CM | POA: Insufficient documentation

## 2015-03-08 DIAGNOSIS — Z3202 Encounter for pregnancy test, result negative: Secondary | ICD-10-CM | POA: Insufficient documentation

## 2015-03-08 DIAGNOSIS — N898 Other specified noninflammatory disorders of vagina: Secondary | ICD-10-CM | POA: Diagnosis not present

## 2015-03-08 DIAGNOSIS — F419 Anxiety disorder, unspecified: Secondary | ICD-10-CM | POA: Diagnosis not present

## 2015-03-08 DIAGNOSIS — Z79899 Other long term (current) drug therapy: Secondary | ICD-10-CM | POA: Insufficient documentation

## 2015-03-08 DIAGNOSIS — K644 Residual hemorrhoidal skin tags: Secondary | ICD-10-CM | POA: Insufficient documentation

## 2015-03-08 DIAGNOSIS — Z87891 Personal history of nicotine dependence: Secondary | ICD-10-CM | POA: Diagnosis not present

## 2015-03-08 LAB — URINALYSIS, ROUTINE W REFLEX MICROSCOPIC
Bilirubin Urine: NEGATIVE
Glucose, UA: NEGATIVE mg/dL
Ketones, ur: NEGATIVE mg/dL
Nitrite: NEGATIVE
Protein, ur: NEGATIVE mg/dL
Specific Gravity, Urine: 1.019 (ref 1.005–1.030)
Urobilinogen, UA: 0.2 mg/dL (ref 0.0–1.0)
pH: 5.5 (ref 5.0–8.0)

## 2015-03-08 LAB — WET PREP, GENITAL
Trich, Wet Prep: NONE SEEN
Yeast Wet Prep HPF POC: NONE SEEN

## 2015-03-08 LAB — BASIC METABOLIC PANEL
Anion gap: 10 (ref 5–15)
BUN: 8 mg/dL (ref 6–20)
CO2: 23 mmol/L (ref 22–32)
Calcium: 9.7 mg/dL (ref 8.9–10.3)
Chloride: 107 mmol/L (ref 101–111)
Creatinine, Ser: 0.77 mg/dL (ref 0.44–1.00)
GFR calc Af Amer: >60 mL/min (ref >60)
GFR calc non Af Amer: >60 mL/min (ref >60)
Glucose, Bld: 105 mg/dL — ABNORMAL HIGH (ref 65–99)
Potassium: 3.9 mmol/L (ref 3.5–5.1)
Sodium: 140 mmol/L (ref 135–145)

## 2015-03-08 LAB — CBC WITH DIFFERENTIAL/PLATELET
Basophils Absolute: 0 10*3/uL (ref 0.0–0.1)
Basophils Relative: 0 % (ref 0–1)
Eosinophils Absolute: 0.2 10*3/uL (ref 0.0–0.7)
Eosinophils Relative: 1 % (ref 0–5)
HCT: 39.9 % (ref 36.0–46.0)
Hemoglobin: 13.2 g/dL (ref 12.0–15.0)
Lymphocytes Relative: 11 % — ABNORMAL LOW (ref 12–46)
Lymphs Abs: 1.8 10*3/uL (ref 0.7–4.0)
MCH: 28.4 pg (ref 26.0–34.0)
MCHC: 33.1 g/dL (ref 30.0–36.0)
MCV: 85.8 fL (ref 78.0–100.0)
Monocytes Absolute: 0.7 10*3/uL (ref 0.1–1.0)
Monocytes Relative: 5 % (ref 3–12)
Neutro Abs: 13.3 10*3/uL — ABNORMAL HIGH (ref 1.7–7.7)
Neutrophils Relative %: 83 % — ABNORMAL HIGH (ref 43–77)
Platelets: 253 10*3/uL (ref 150–400)
RBC: 4.65 MIL/uL (ref 3.87–5.11)
RDW: 14 % (ref 11.5–15.5)
WBC: 16.1 10*3/uL — ABNORMAL HIGH (ref 4.0–10.5)

## 2015-03-08 LAB — URINE MICROSCOPIC-ADD ON

## 2015-03-08 LAB — POC URINE PREG, ED: Preg Test, Ur: NEGATIVE

## 2015-03-08 MED ORDER — DOCUSATE SODIUM 100 MG PO CAPS: 100.0000 mg | ORAL_CAPSULE | Freq: Every day | ORAL | Status: DC | PRN

## 2015-03-08 MED ORDER — HYDROMORPHONE HCL 1 MG/ML IJ SOLN
1.0000 mg | Freq: Once | INTRAMUSCULAR | Status: AC
Start: 2015-03-08 — End: 2015-03-08
  Administered 2015-03-08: 1 mg via INTRAVENOUS
  Filled 2015-03-08: qty 1

## 2015-03-08 MED ORDER — KETOROLAC TROMETHAMINE 30 MG/ML IJ SOLN
30.0000 mg | Freq: Once | INTRAMUSCULAR | Status: AC
  Administered 2015-03-08: 30 mg via INTRAVENOUS
  Filled 2015-03-08: qty 1

## 2015-03-08 MED ORDER — HYDROCORTISONE ACETATE 25 MG RE SUPP: 25.0000 mg | Freq: Two times a day (BID) | RECTAL | Status: DC

## 2015-03-08 MED ORDER — TAMSULOSIN HCL 0.4 MG PO CAPS: 0.4000 mg | ORAL_CAPSULE | Freq: Every day | ORAL | Status: DC

## 2015-03-08 MED ORDER — CIPROFLOXACIN HCL 500 MG PO TABS: 500.0000 mg | ORAL_TABLET | Freq: Two times a day (BID) | ORAL | Status: DC

## 2015-03-08 MED ORDER — OXYCODONE-ACETAMINOPHEN 5-325 MG PO TABS
1.0000 | ORAL_TABLET | Freq: Once | ORAL | Status: AC
  Administered 2015-03-08: 1 via ORAL

## 2015-03-08 MED ORDER — OXYCODONE-ACETAMINOPHEN 5-325 MG PO TABS: 1.0000 | ORAL_TABLET | ORAL | Status: DC | PRN

## 2015-03-08 MED ORDER — ONDANSETRON HCL 4 MG/2ML IJ SOLN
4.0000 mg | Freq: Once | INTRAMUSCULAR | Status: AC
  Administered 2015-03-08: 4 mg via INTRAVENOUS
  Filled 2015-03-08: qty 2

## 2015-03-08 MED ORDER — OXYCODONE-ACETAMINOPHEN 5-325 MG PO TABS
ORAL_TABLET | ORAL | Status: AC
  Filled 2015-03-08: qty 1

## 2015-03-08 NOTE — Discharge Instructions (Signed)
Read the information below.  Use the prescribed medication as directed.  Please discuss all new medications with your pharmacist.  Do not take additional tylenol while taking the prescribed pain medication to avoid overdose.  You may return to the Emergency Department at any time for worsening condition or any new symptoms that concern you.  If you develop high fevers, worsening abdominal pain, uncontrolled vomiting, or are unable to tolerate fluids by mouth, return to the ER for a recheck.   ° ° °Flank Pain °Flank pain refers to pain that is located on the side of the body between the upper abdomen and the back. The pain may occur over a short period of time (acute) or may be long-term or reoccurring (chronic). It may be mild or severe. Flank pain can be caused by many things. °CAUSES  °Some of the more common causes of flank pain include: °· Muscle strains.   °· Muscle spasms.   °· A disease of your spine (vertebral disk disease).   °· A lung infection (pneumonia).   °· Fluid around your lungs (pulmonary edema).   °· A kidney infection.   °· Kidney stones.   °· A very painful skin rash caused by the chickenpox virus (shingles).   °· Gallbladder disease.   °HOME CARE INSTRUCTIONS  °Home care will depend on the cause of your pain. In general, °· Rest as directed by your caregiver. °· Drink enough fluids to keep your urine clear or pale yellow. °· Only take over-the-counter or prescription medicines as directed by your caregiver. Some medicines may help relieve the pain. °· Tell your caregiver about any changes in your pain. °· Follow up with your caregiver as directed. °SEEK IMMEDIATE MEDICAL CARE IF:  °· Your pain is not controlled with medicine.   °· You have new or worsening symptoms. °· Your pain increases.   °· You have abdominal pain.   °· You have shortness of breath.   °· You have persistent nausea or vomiting.   °· You have swelling in your abdomen.   °· You feel faint or pass out.   °· You have blood in  your urine. °· You have a fever or persistent symptoms for more than 2-3 days. °· You have a fever and your symptoms suddenly get worse. °MAKE SURE YOU:  °· Understand these instructions. °· Will watch your condition. °· Will get help right away if you are not doing well or get worse. °Document Released: 08/10/2005 Document Revised: 03/13/2012 Document Reviewed: 02/01/2012 °[employer]® Patient Information ©2015 [employer]. This information is not intended to replace advice given to you by your health care provider. Make sure you discuss any questions you have with your health care provider. ° °

## 2015-03-08 NOTE — ED Notes (Signed)
Pt reports to the ED for eval of left sided flank pain that began approx 1-2 days ago. She reports the pain is much worse with urination. She also has urinary urgency and frequency. She has a hx of nephrolithiasis and reports this feels similar. Associated N/V and possible hematuria. Pt A&Ox4, resp e/u, and skin warm and dry.

## 2015-03-08 NOTE — ED Provider Notes (Signed)
CSN: [id number]     Arrival date & time 03/08/15  1314 History  This chart was scribed for non-physician practitioner working Trixie Dredge PA-C with Rolan Bucco, MD by Lyndel Safe, ED Scribe. This patient was seen in room TR03C/TR03C and the patient's care was started at 2:06 PM.     Chief Complaint  Patient presents with  . Flank Pain   The history is provided by the patient. No language interpreter was used.   HPI Comments: DELROSE ROHWER is a 24 y.o. female, with a PMhx of renal calculi, hematuria, and right ureteral stone, who presents to the Emergency Department complaining of sudden onset, intermittent, 8/10 left-sided flank pain that began yesterday.The pt describes the flank pain to be a throbbing pain that is exacerbated after urination with associated frequency of urination, hematuria, and dysuria that she describes as a sharp pain all onset 1 day ago. She additionally reports nausea and vomiting this morning with a light-headedness with onset of her intermittent flank pain. Pt also notes abnormal, stringy vaginal discharge that she noticed this morning. The pt states this pain feels similar to past pain experienced with kidney stones. She is followed by multiple MDs at [facility]. She notes a history of multiple kidney stones for which she has only required surgery once. On pt's last CT in April of 2016 a 4mm stone was noted in the proximal left ureter. Pt takes Subutex daily; she took .5 of a tablet this morning with no relief of pain. She is prescribed Subutex to ween off pain medication after having surgery for renal calculi but is not currently on a pain medication contract. LNMP 2.5 weeks ago. Denies fevers, chills, changes in bowel habits (frequent constipation due to narcotic use), blood in stool, rashes, or chance of pregnancy.  Denies unprotected sex or concern for STD.   Past Medical History  Diagnosis Date  . ADHD (attention deficit hyperactivity disorder)   . Anxiety  disorder   . Right ureteral stone   . Hematuria   . Frequency of urination   . History of kidney stones   . Renal disorder     Kidney stones   Past Surgical History  Procedure Laterality Date  . Wisdom tooth extraction    . Cystoscopy with ureteroscopy and stent placement Right 03/17/2013    Procedure: CYSTOSCOPY WITH RIGHT URETEROSCOPY HOLMIUM LASER OF STONESER LITHOTRIPSY AND STENT PLACEMENT;  Surgeon: Garnett Farm, MD;  Location: [facility];  Service: Urology;  Laterality: Right;   Family History  Problem Relation Age of Onset  . Cancer Father    Social History  Substance Use Topics  . Smoking status: Former Smoker    Types: Cigarettes    Quit date: 12/31/2013  . Smokeless tobacco: Never Used     Comment: occasional smoker of cigarettes and e-cig since 2012  . Alcohol Use: No     Comment: occasional   OB History    No data available     Review of Systems  Constitutional: Negative for fever and chills.  Gastrointestinal: Positive for nausea and vomiting. Negative for diarrhea, constipation and blood in stool.  Genitourinary: Positive for dysuria, flank pain ( left) and vaginal discharge.  Allergic/Immunologic: Negative for immunocompromised state.  Neurological: Positive for light-headedness.  Hematological: Does not bruise/bleed easily.  All other systems reviewed and are negative.  Allergies  Nickel  Home Medications   Prior to Admission medications   Medication Sig Start Date End Date Taking? Authorizing  Provider  ALPRAZolam (XANAX XR) 1 MG 24 hr tablet Take 1 mg by mouth every morning.    Historical Provider, MD  ALPRAZolam Prudy Feeler) 1 MG tablet Take 1 mg by mouth 3 (three) times daily as needed for anxiety.    Historical Provider, MD  amphetamine-dextroamphetamine (ADDERALL XR) 15 MG 24 hr capsule Take 15 mg by mouth every morning.    Historical Provider, MD  buprenorphine (SUBUTEX) 8 MG SUBL SL tablet Place 8-12 mg under the tongue daily.   10/23/14   Historical Provider, MD  ciprofloxacin (CIPRO) 500 MG tablet Take 1 tablet (500 mg total) by mouth 2 (two) times daily. Patient not taking: Reported on 07/09/2014 06/03/14   Shade Flood, MD  HYDROcodone-acetaminophen (NORCO/VICODIN) 5-325 MG per tablet Take 1 tablet by mouth every 6 (six) hours as needed for moderate pain. Patient not taking: Reported on 07/09/2014 06/03/14   Shade Flood, MD  ketorolac (TORADOL) 10 MG tablet Take 1 tablet (10 mg total) by mouth every 6 (six) hours as needed. 10/26/14   Loren Racer, MD  LATUDA 20 MG TABS Take 20 mg by mouth daily.  10/22/14   Historical Provider, MD  ondansetron (ZOFRAN ODT) 4 MG disintegrating tablet 4mg  ODT q4 hours prn nausea/vomit 10/26/14   Loren Racer, MD  oxyCODONE-acetaminophen (PERCOCET) 5-325 MG per tablet Take 1-2 tablets by mouth every 4 (four) hours as needed. 10/26/14   Loren Racer, MD  tamsulosin (FLOMAX) 0.4 MG CAPS capsule Take 1 capsule (0.4 mg total) by mouth daily. 10/26/14   Loren Racer, MD  valACYclovir (VALTREX) 1000 MG tablet Take 1,000 mg by mouth daily as needed (outbreak).     Historical Provider, MD   BP 134/92 mmHg  Pulse 104  Temp(Src) 98.4 F (36.9 C) (Oral)  Resp 16  SpO2 100% Physical Exam  Constitutional: She appears well-developed and well-nourished. No distress.  HENT:  Head: Normocephalic and atraumatic.  Neck: Neck supple.  Cardiovascular: Normal rate and regular rhythm.   Pulmonary/Chest: Effort normal and breath sounds normal. No respiratory distress. She has no wheezes. She has no rales.  Abdominal: Soft. She exhibits no distension. There is tenderness. There is no rebound and no guarding.  Left CVA tenderness; mild, vague tenderness to left flank.   Genitourinary: Vaginal discharge found.  Small amount of mucousy, vaginal discharge that appears physiologic; single soft hemorrhoid noted, no erythema, no induration.   Neurological: She is alert.  Skin: She is not  diaphoretic.  Nursing note and vitals reviewed.   ED Course  Procedures  DIAGNOSTIC STUDIES: Oxygen Saturation is 100% on RA, normal by my interpretation.    COORDINATION OF CARE: 2:16 PM Discussed treatment plan with pt. Pain medication ordered. Will order US renal, perform pelvic exam, and order diagnostic labs. Pt acknowledges and agrees to plan.  3:20 PM Pelvic exam performed with chaperone present throughout entire exam.   Labs Review Labs Reviewed  WET PREP, GENITAL - Abnormal; Notable for the following:    Clue Cells Wet Prep HPF POC FEW (*)    WBC, Wet Prep HPF POC TOO NUMEROUS TO COUNT (*)    All other components within normal limits  URINALYSIS, ROUTINE W REFLEX MICROSCOPIC (NOT AT [facility]) - Abnormal; Notable for the following:    APPearance HAZY (*)    Hgb urine dipstick SMALL (*)    Leukocytes, UA LARGE (*)    All other components within normal limits  BASIC METABOLIC PANEL - Abnormal; Notable for the following:  Glucose, Bld 105 (*)    All other components within normal limits  CBC WITH DIFFERENTIAL/PLATELET - Abnormal; Notable for the following:    WBC 16.1 (*)    Neutrophils Relative % 83 (*)    Neutro Abs 13.3 (*)    Lymphocytes Relative 11 (*)    All other components within normal limits  URINE MICROSCOPIC-ADD ON - Abnormal; Notable for the following:    Squamous Epithelial / LPF FEW (*)    Bacteria, UA FEW (*)    All other components within normal limits  URINE CULTURE  HIV ANTIBODY (ROUTINE TESTING)  RPR  POC URINE PREG, ED  GC/CHLAMYDIA PROBE AMP ([place]) NOT AT [facility]    Imaging Review US Renal  03/08/2015   CLINICAL DATA:  Left flank pain.  Urinary urgency.  EXAM: RENAL / URINARY TRACT ULTRASOUND COMPLETE  COMPARISON:  None.  FINDINGS: Right Kidney:  Length: 12.5 cm. Echogenicity within normal limits. No mass or hydronephrosis visualized.  Left Kidney:  Length: 12.2 cm. Echogenicity within normal limits. There is a 5 mm echogenic focus in the  lower pole of the left kidney likely representing nephrolithiasis. No mass or hydronephrosis visualized.  Bladder:  Appears normal for degree of bladder distention.  IMPRESSION: 1. Nonobstructing left nephrolithiasis. 2. No hydronephrosis.   Electronically Signed   By: Elige Ko   On: 03/08/2015 15:56   I have personally reviewed and evaluated these images and lab results as part of my medical decision-making.   EKG Interpretation None      MDM   Final diagnoses:  Left flank pain  External hemorrhoid    Afebrile, nontoxic patient with left flank pain and urinary symptoms.  She does have hx both UTIs and kidney stones.  Renal US is unremarkable, no obstruction or hydronephrosis.  Urine may be infected - WBC TNTC but few bacteria, will send for culture and will treat given symptoms.  She does have serum leukocytosis.  Wet prep also shows WBC TNTC but discharge appears physiologic, as if she is currently ovulating.  No cervical motion tenderness, clinically no PID.  Pt does have soft external hemorrhoid that has been bothering her, noted on exam, not thrombosed.   Pt feeling much better after medications.  D/C home with coverage for UTI, pain medication, flomax.  Urology follow up.  Pt is on subutex but has a plan with her PCP regarding cutting back when she is prescribed other medications- states she will speak with her Dr regarding this prescription and also states that her father controls all of her medications.  Discussed result, findings, treatment, and follow up  with patient.  Pt given return precautions.  Pt verbalizes understanding and agrees with plan.         I personally performed the services described in this documentation, which was scribed in my presence. The recorded information has been reviewed and is accurate.     [place], PA-C 03/08/15 1808  Rolan Bucco, MD 03/14/15 [phone]

## 2015-03-08 NOTE — ED Notes (Signed)
Pt. Returned from US

## 2015-03-08 NOTE — ED Notes (Signed)
Patient transported to Ultrasound 

## 2015-03-08 NOTE — ED Notes (Signed)
PA at the bedside.

## 2015-03-08 NOTE — ED Notes (Signed)
PA at the bedside. Finishing pelvic.

## 2015-03-09 LAB — GC/CHLAMYDIA PROBE AMP (~~LOC~~) NOT AT ARMC
Chlamydia: NEGATIVE
Neisseria Gonorrhea: NEGATIVE

## 2015-03-09 LAB — HIV ANTIBODY (ROUTINE TESTING W REFLEX): HIV Screen 4th Generation wRfx: NONREACTIVE

## 2015-03-09 LAB — RPR: RPR Ser Ql: NONREACTIVE

## 2015-03-10 LAB — URINE CULTURE

## 2015-03-11 ENCOUNTER — Emergency Department (HOSPITAL_COMMUNITY)
Admission: EM | Admit: 2015-03-11 | Discharge: 2015-03-12 | Disposition: A | Discharge status: Home / Self Care | Payer: Managed Care, Other (non HMO) | Attending: Emergency Medicine | Admitting: Emergency Medicine

## 2015-03-11 ENCOUNTER — Encounter (HOSPITAL_COMMUNITY): Payer: Self-pay | Admitting: *Deleted

## 2015-03-11 DIAGNOSIS — Z87891 Personal history of nicotine dependence: Secondary | ICD-10-CM | POA: Insufficient documentation

## 2015-03-11 DIAGNOSIS — N200 Calculus of kidney: Secondary | ICD-10-CM | POA: Diagnosis not present

## 2015-03-11 DIAGNOSIS — R109 Unspecified abdominal pain: Secondary | ICD-10-CM | POA: Diagnosis present

## 2015-03-11 DIAGNOSIS — Z79899 Other long term (current) drug therapy: Secondary | ICD-10-CM | POA: Diagnosis not present

## 2015-03-11 DIAGNOSIS — F419 Anxiety disorder, unspecified: Secondary | ICD-10-CM | POA: Diagnosis not present

## 2015-03-11 LAB — URINE MICROSCOPIC-ADD ON

## 2015-03-11 LAB — URINALYSIS, ROUTINE W REFLEX MICROSCOPIC
Bilirubin Urine: NEGATIVE
Glucose, UA: NEGATIVE mg/dL
Ketones, ur: NEGATIVE mg/dL
Leukocytes, UA: NEGATIVE
Nitrite: NEGATIVE
Protein, ur: 30 mg/dL — AB
Specific Gravity, Urine: 1.021 (ref 1.005–1.030)
Urobilinogen, UA: 0.2 mg/dL (ref 0.0–1.0)
pH: 6 (ref 5.0–8.0)

## 2015-03-11 LAB — I-STAT CHEM 8, ED
BUN: 11 mg/dL (ref 6–20)
Calcium, Ion: 1.2 mmol/L (ref 1.12–1.23)
Chloride: 105 mmol/L (ref 101–111)
Creatinine, Ser: 0.6 mg/dL (ref 0.44–1.00)
Glucose, Bld: 102 mg/dL — ABNORMAL HIGH (ref 65–99)
HCT: 44 % (ref 36.0–46.0)
Hemoglobin: 15 g/dL (ref 12.0–15.0)
Potassium: 4.2 mmol/L (ref 3.5–5.1)
Sodium: 141 mmol/L (ref 135–145)
TCO2: 22 mmol/L (ref 0–100)

## 2015-03-11 MED ORDER — KETOROLAC TROMETHAMINE 30 MG/ML IJ SOLN
30.0000 mg | Freq: Once | INTRAMUSCULAR | Status: AC
  Administered 2015-03-11: 30 mg via INTRAVENOUS
  Filled 2015-03-11: qty 1

## 2015-03-11 MED ORDER — ONDANSETRON HCL 4 MG/2ML IJ SOLN
4.0000 mg | Freq: Once | INTRAMUSCULAR | Status: AC
  Administered 2015-03-11: 4 mg via INTRAVENOUS
  Filled 2015-03-11: qty 2

## 2015-03-11 MED ORDER — HYDROMORPHONE HCL 1 MG/ML IJ SOLN
1.0000 mg | Freq: Once | INTRAMUSCULAR | Status: AC
  Administered 2015-03-11: 1 mg via INTRAVENOUS
  Filled 2015-03-11: qty 1

## 2015-03-11 MED ORDER — PROMETHAZINE HCL 25 MG/ML IJ SOLN
12.5000 mg | Freq: Once | INTRAMUSCULAR | Status: AC
  Administered 2015-03-11: 12.5 mg via INTRAVENOUS
  Filled 2015-03-11: qty 1

## 2015-03-11 MED ORDER — SODIUM CHLORIDE 0.9 % IV SOLN
INTRAVENOUS | Status: AC
  Administered 2015-03-11: 21:00:00 via INTRAVENOUS

## 2015-03-11 NOTE — ED Notes (Signed)
Pt states that she is out of pain medicine prescribed by the ED. Pt states that she sees her Dr tomorrow morning.

## 2015-03-11 NOTE — ED Provider Notes (Signed)
CSN: [id number]     Arrival date & time 03/11/15  1939 History  This chart was scribed for non-physician practitioner, Kerrie Buffalo, NP working with Tilden Fossa, MD, by Jarvis Morgan, ED Scribe. This patient was seen in room TR07C/TR07C and the patient's care was started at 8:07 PM.     Chief Complaint  Patient presents with  . Nephrolithiasis    The history is provided by the patient. No language interpreter was used.    HPI Comments: Emily Silva is a 24 y.o. female who presents to the Emergency Department complaining of pain due to her nephrolithiasis. Pt was seen in the ER 03/08/15, 3 days ago and was dx with a kidney stone and was d/c home with prescription for Flomax, Percocet and Toradol. Pt reports she is also taking Subutex at the same time and talked to a pharmacist and believes the Subutex is interacting with the effectiveness of the other medications. She reports associated nausea and emesis due to pain and is unable to keep down her medications. She states she has an appt with her psychiatrist tomorrow who prescribes Subutex and has an appt with an urologist in the next few days. She denies any other complaints at this time.  Past Medical History  Diagnosis Date  . ADHD (attention deficit hyperactivity disorder)   . Anxiety disorder   . Right ureteral stone   . Hematuria   . Frequency of urination   . History of kidney stones   . Renal disorder     Kidney stones   Past Surgical History  Procedure Laterality Date  . Wisdom tooth extraction    . Cystoscopy with ureteroscopy and stent placement Right 03/17/2013    Procedure: CYSTOSCOPY WITH RIGHT URETEROSCOPY HOLMIUM LASER OF STONESER LITHOTRIPSY AND STENT PLACEMENT;  Surgeon: Garnett Farm, MD;  Location: [facility];  Service: Urology;  Laterality: Right;   Family History  Problem Relation Age of Onset  . Cancer Father    Social History  Substance Use Topics  . Smoking status: Former Smoker     Types: Cigarettes    Quit date: 12/31/2013  . Smokeless tobacco: Never Used     Comment: occasional smoker of cigarettes and e-cig since 2012  . Alcohol Use: No     Comment: occasional   OB History    No data available     Review of Systems  Constitutional: Negative for fever and chills.  Gastrointestinal: Positive for nausea and vomiting.  Genitourinary: Positive for dysuria and flank pain (left).  All other systems reviewed and are negative.     Allergies  Nickel  Home Medications   Prior to Admission medications   Medication Sig Start Date End Date Taking? Authorizing Provider  ALPRAZolam (XANAX XR) 1 MG 24 hr tablet Take 1 mg by mouth every morning.   Yes Historical Provider, MD  ALPRAZolam Prudy Feeler) 1 MG tablet Take 1 mg by mouth 3 (three) times daily as needed for anxiety.   Yes Historical Provider, MD  amphetamine-dextroamphetamine (ADDERALL XR) 15 MG 24 hr capsule Take 15 mg by mouth every morning.   Yes Historical Provider, MD  buprenorphine (SUBUTEX) 8 MG SUBL SL tablet Place 8-12 mg under the tongue daily.  10/23/14  Yes Historical Provider, MD  ciprofloxacin (CIPRO) 500 MG tablet Take 1 tablet (500 mg total) by mouth 2 (two) times daily. One po bid x 7 days 03/08/15  Yes Trixie Dredge, PA-C  docusate sodium (COLACE) 100 MG capsule Take  1 capsule (100 mg total) by mouth daily as needed for mild constipation or moderate constipation. 03/08/15  Yes Trixie Dredge, PA-C  oxyCODONE-acetaminophen (PERCOCET) 5-325 MG per tablet Take 1-2 tablets by mouth every 4 (four) hours as needed. 03/08/15  Yes Trixie Dredge, PA-C  tamsulosin (FLOMAX) 0.4 MG CAPS capsule Take 1 capsule (0.4 mg total) by mouth daily. 03/08/15  Yes Trixie Dredge, PA-C  valACYclovir (VALTREX) 1000 MG tablet Take 1,000 mg by mouth daily as needed (outbreak).    Yes Historical Provider, MD  hydrocortisone (ANUSOL-HC) 25 MG suppository Place 1 suppository (25 mg total) rectally 2 (two) times daily. For 7 days Patient not taking:  Reported on 03/11/2015 03/08/15   Trixie Dredge, PA-C  ketorolac (TORADOL) 10 MG tablet Take 1 tablet (10 mg total) by mouth every 6 (six) hours as needed. Patient not taking: Reported on 03/11/2015 10/26/14   Loren Racer, MD  ondansetron (ZOFRAN ODT) 4 MG disintegrating tablet 4mg  ODT q4 hours prn nausea/vomit Patient not taking: Reported on 03/11/2015 10/26/14   Loren Racer, MD   Triage Vitals: BP 139/97 mmHg  Pulse 94  Temp(Src) 98.9 F (37.2 C) (Oral)  Resp 22  Ht 5\' 11"  (1.803 m)  Wt 213 lb (96.616 kg)  BMI 29.72 kg/m2  SpO2 99%  Physical Exam  Constitutional: She is oriented to person, place, and time. She appears well-developed and well-nourished.  HENT:  Head: Normocephalic and atraumatic.  Eyes: EOM are normal. Pupils are equal, round, and reactive to light.  Neck: Neck supple.  Cardiovascular: Normal rate and regular rhythm.   Pulmonary/Chest: Effort normal.  Abdominal: Soft. There is tenderness in the left lower quadrant. There is no rebound and no guarding. CVA tenderness: left and radiates to LLQ.  Left flank pain that radiates to the LLQ.  Musculoskeletal: Normal range of motion. She exhibits no edema.  Neurological: She is alert and oriented to person, place, and time. No cranial nerve deficit.  Skin: Skin is warm and dry.  Psychiatric: She has a normal mood and affect. Her behavior is normal.  Nursing note and vitals reviewed.   ED Course  Procedures (including critical care time)  IV fluids, Toradol 30 mg IV, Phenergan 12. Mg IV UA  DIAGNOSTIC STUDIES: Oxygen Saturation is 99% on RA, normal by my interpretation.    COORDINATION OF CARE:    Labs Review Results for orders placed or performed during the hospital encounter of 03/11/15 (from the past 24 hour(s))  I-Stat Chem 8, ED     Status: Abnormal   Collection Time: 03/11/15  8:46 PM  Result Value Ref Range   Sodium 141 135 - 145 mmol/L   Potassium 4.2 3.5 - 5.1 mmol/L   Chloride 105 101 - 111 mmol/L    BUN 11 6 - 20 mg/dL   Creatinine, Ser 8.14 0.44 - 1.00 mg/dL   Glucose, Bld 481 (H) 65 - 99 mg/dL   Calcium, Ion 8.56 1.12 - 1.23 mmol/L   TCO2 22 0 - 100 mmol/L   Hemoglobin 15.0 12.0 - 15.0 g/dL   HCT 31.4 97.0 - 26.3 %  Urinalysis, Routine w reflex microscopic (not at [facility])     Status: Abnormal   Collection Time: 03/11/15 10:00 PM  Result Value Ref Range   Color, Urine YELLOW YELLOW   APPearance CLEAR CLEAR   Specific Gravity, Urine 1.021 1.005 - 1.030   pH 6.0 5.0 - 8.0   Glucose, UA NEGATIVE NEGATIVE mg/dL   Hgb urine dipstick LARGE (A)  NEGATIVE   Bilirubin Urine NEGATIVE NEGATIVE   Ketones, ur NEGATIVE NEGATIVE mg/dL   Protein, ur 30 (A) NEGATIVE mg/dL   Urobilinogen, UA 0.2 0.0 - 1.0 mg/dL   Nitrite NEGATIVE NEGATIVE   Leukocytes, UA NEGATIVE NEGATIVE  Urine microscopic-add on     Status: Abnormal   Collection Time: 03/11/15 10:00 PM  Result Value Ref Range   Squamous Epithelial / LPF FEW (A) RARE   WBC, UA 0-2 <3 WBC/hpf   RBC / HPF 21-50 <3 RBC/hpf   Bacteria, UA RARE RARE   Urine-Other MUCOUS PRESENT      MDM  24 y.o. female with hx of kidney stone and here with increased pain/n/v. IV fluids infusing and patient feeling better after Toradol and phenergan but still having some pain and nausea. Will give Dilaudid and Zofran. Care turned over to [facility] @ 11:15 pm.   I personally performed the services described in this documentation, which was scribed in my presence. The recorded information has been reviewed and is accurate.   [address] [place], [place] 03/11/15 2318  Tilden Fossa, MD 03/12/15 [phone]

## 2015-03-11 NOTE — ED Notes (Signed)
Pt states that she was here a few days ago and diagnosed with a kidney stone. States that she has not been able to control her pain because of medicine that is prescribed by her psychologist (subutex).

## 2015-03-12 MED ORDER — OXYCODONE-ACETAMINOPHEN 5-325 MG PO TABS: 1.0000 | ORAL_TABLET | ORAL | Status: DC | PRN

## 2015-03-12 MED ORDER — ONDANSETRON HCL 4 MG PO TABS: 4.0000 mg | ORAL_TABLET | Freq: Three times a day (TID) | ORAL | Status: DC | PRN

## 2015-03-12 NOTE — Discharge Instructions (Signed)

## 2015-03-15 ENCOUNTER — Ambulatory Visit (INDEPENDENT_AMBULATORY_CARE_PROVIDER_SITE_OTHER): Payer: Managed Care, Other (non HMO) | Admitting: Internal Medicine

## 2015-03-15 VITALS — BP 130/80 | HR 96 | Temp 98.2°F | Resp 16 | Ht 69.75 in | Wt 210.5 lb

## 2015-03-15 DIAGNOSIS — N23 Unspecified renal colic: Secondary | ICD-10-CM

## 2015-03-15 DIAGNOSIS — N390 Urinary tract infection, site not specified: Secondary | ICD-10-CM

## 2015-03-15 DIAGNOSIS — R8281 Pyuria: Secondary | ICD-10-CM

## 2015-03-15 DIAGNOSIS — F411 Generalized anxiety disorder: Secondary | ICD-10-CM

## 2015-03-15 DIAGNOSIS — F419 Anxiety disorder, unspecified: Secondary | ICD-10-CM | POA: Insufficient documentation

## 2015-03-15 DIAGNOSIS — F909 Attention-deficit hyperactivity disorder, unspecified type: Secondary | ICD-10-CM | POA: Diagnosis not present

## 2015-03-15 LAB — POCT UA - MICROSCOPIC ONLY
Casts, Ur, LPF, POC: NEGATIVE
Crystals, Ur, HPF, POC: NEGATIVE
Yeast, UA: NEGATIVE

## 2015-03-15 LAB — POCT URINALYSIS DIPSTICK
Bilirubin, UA: NEGATIVE
Glucose, UA: NEGATIVE
Ketones, UA: NEGATIVE
Nitrite, UA: NEGATIVE
Protein, UA: NEGATIVE
Spec Grav, UA: 1.02
Urobilinogen, UA: 0.2
pH, UA: 6.5

## 2015-03-15 MED ORDER — CIPROFLOXACIN HCL 500 MG PO TABS: 500.0000 mg | ORAL_TABLET | Freq: Two times a day (BID) | ORAL | Status: DC

## 2015-03-15 MED ORDER — KETOROLAC TROMETHAMINE 60 MG/2ML IM SOLN
60.0000 mg | Freq: Once | INTRAMUSCULAR | Status: AC
  Administered 2015-03-15: 60 mg via INTRAMUSCULAR

## 2015-03-15 MED ORDER — PROMETHAZINE HCL 25 MG PO TABS: 25.0000 mg | ORAL_TABLET | Freq: Four times a day (QID) | ORAL | Status: DC | PRN

## 2015-03-15 MED ORDER — OXYCODONE-ACETAMINOPHEN 5-325 MG PO TABS: 1.0000 | ORAL_TABLET | Freq: Four times a day (QID) | ORAL | Status: DC | PRN

## 2015-03-15 NOTE — Progress Notes (Signed)
Subjective:    Patient ID: Emily Silva, female    DOB: 1991-01-21, 24 y.o.   MRN: [id number] This chart was scribed for Ellamae Sia, MD by Jolene Provost, Medical Scribe. This patient was seen in Room 9 and the patient's care was started a 7:55 PM.  Chief Complaint  Patient presents with  . Follow-up    ER Visit-Kidney Stones-Still having pain  . Nausea  . Chills    HPI HPI Comments: Emily Silva is a 24 y.o. female who presents to [facility] complaining of intermittent severe pain due to a kidney stone which she started feeling on May 29th, 2016. She reported to the ER 5th September and again on September 9th due to severe pain and was diagnosed with a left sided kidney stone via [place]. She states she has a past hx of kidney stones, and had to have basket removal once. Symptoms havre remained constant since 9/9 with movement of pain from L flank to Lateral L abd. No dysuria but mild frequency. Completed 7d cipro today(UC was neg) and is out of pain meds today. Can't use phen suppos. Nausea persists w/out vom. Able to hold fluids. Pain 7out of 10 with bursts of sharpness every few hrs.  Has Appt with Urol on Friday.  Patient Active Problem List   Diagnosis Date Noted  . ADHD (attention deficit hyperactivity disorder) 03/15/2015  . Anxiety disorder 03/15/2015  . Ureteral calculus, right 03/16/2013  on meds Tamela Oddi [facility] looks ok  Review of Systems  Constitutional: Positive for chills. Negative for fever.  Gastrointestinal: Positive for nausea. Negative for vomiting.  Genitourinary: Positive for flank pain. Negative for dysuria, urgency and frequency.       Objective:   Physical Exam  Constitutional: She is oriented to person, place, and time. She appears well-developed and well-nourished. No distress.  HENT:  Head: Normocephalic and atraumatic.  Eyes: Pupils are equal, round, and reactive to light.  Neck: Neck supple.  Cardiovascular: Normal rate.   Pulmonary/Chest:  Effort normal. No respiratory distress.  Abdominal: Bowel sounds are normal. She exhibits no mass. There is no tenderness. There is no rebound and no guarding.  Positive percussion tenderness L Flank  Neurological: She is alert and oriented to person, place, and time. No cranial nerve deficit.  Skin: Skin is warm and dry. She is not diaphoretic.  Psychiatric: She has a normal mood and affect. Her behavior is normal.  Nursing note and vitals reviewed.   Filed Vitals:   03/15/15 1919  BP: 130/80  Pulse: 96  Temp: 98.2 F (36.8 C)  TempSrc: Oral  Resp: 16  Height: 5' 9.75" (1.772 m)  Weight: 210 lb 8 oz (95.482 kg)  SpO2: 97%   Results for orders placed or performed in visit on 03/15/15  POCT UA - Microscopic Only  Result Value Ref Range   WBC, Ur, HPF, POC 5-8    RBC, urine, microscopic 3-6    Bacteria, U Microscopic 1+    Mucus, UA 1+    Epithelial cells, urine per micros 2-5    Crystals, Ur, HPF, POC neg    Casts, Ur, LPF, POC neg    Yeast, UA neg   POCT urinalysis dipstick  Result Value Ref Range   Color, UA yellow    Clarity, UA hazy    Glucose, UA neg    Bilirubin, UA neg    Ketones, UA neg    Spec Grav, UA 1.020    Blood, UA small  pH, UA 6.5    Protein, UA neg    Urobilinogen, UA 0.2    Nitrite, UA neg    Leukocytes, UA small (1+) (A) Negative       Assessment & Plan:  Renal colic on left side -  Pyuria - Plan: Urine culture--3 more days cipro added  Meds ordered this encounter  Medications  . ketorolac (TORADOL) injection 60 mg given in office  . ciprofloxacin (CIPRO) 500 MG tablet    Sig: Take 1 tablet (500 mg total) by mouth 2 (two) times daily. To complete 10d course    Dispense:  6 tablet    Refill:  0  . promethazine (PHENERGAN) 25 MG tablet    Sig: Take 1 tablet (25 mg total) by mouth every 6 (six) hours as needed for nausea or vomiting.    Dispense:  20 tablet    Refill:  0  . oxyCODONE-acetaminophen (PERCOCET/ROXICET) 5-325 MG per  tablet    Sig: Take 1-2 tablets by mouth every 6 (six) hours as needed for severe pain.    Dispense:  20 tablet    Refill:  0   F/u dr Hillis Range end of week     I have completed the patient encounter in its entirety as documented by the scribe, with editing by me where necessary. Fredis Malkiewicz P. Merla Riches, M.D.

## 2015-03-18 LAB — URINE CULTURE: Colony Count: 25000

## 2015-09-12 ENCOUNTER — Emergency Department (HOSPITAL_COMMUNITY): Payer: Managed Care, Other (non HMO)

## 2015-09-12 ENCOUNTER — Encounter (HOSPITAL_COMMUNITY): Payer: Self-pay | Admitting: Emergency Medicine

## 2015-09-12 ENCOUNTER — Emergency Department (HOSPITAL_COMMUNITY)
Admission: EM | Admit: 2015-09-12 | Discharge: 2015-09-12 | Disposition: A | Discharge status: Home / Self Care | Payer: Managed Care, Other (non HMO) | Attending: Emergency Medicine | Admitting: Emergency Medicine

## 2015-09-12 DIAGNOSIS — F909 Attention-deficit hyperactivity disorder, unspecified type: Secondary | ICD-10-CM | POA: Insufficient documentation

## 2015-09-12 DIAGNOSIS — Z3202 Encounter for pregnancy test, result negative: Secondary | ICD-10-CM | POA: Diagnosis not present

## 2015-09-12 DIAGNOSIS — N200 Calculus of kidney: Secondary | ICD-10-CM | POA: Diagnosis not present

## 2015-09-12 DIAGNOSIS — R109 Unspecified abdominal pain: Secondary | ICD-10-CM

## 2015-09-12 DIAGNOSIS — Z87891 Personal history of nicotine dependence: Secondary | ICD-10-CM | POA: Diagnosis not present

## 2015-09-12 DIAGNOSIS — Z79899 Other long term (current) drug therapy: Secondary | ICD-10-CM | POA: Diagnosis not present

## 2015-09-12 DIAGNOSIS — R319 Hematuria, unspecified: Secondary | ICD-10-CM

## 2015-09-12 DIAGNOSIS — M549 Dorsalgia, unspecified: Secondary | ICD-10-CM | POA: Diagnosis present

## 2015-09-12 DIAGNOSIS — F419 Anxiety disorder, unspecified: Secondary | ICD-10-CM | POA: Insufficient documentation

## 2015-09-12 DIAGNOSIS — R112 Nausea with vomiting, unspecified: Secondary | ICD-10-CM | POA: Insufficient documentation

## 2015-09-12 LAB — URINALYSIS, ROUTINE W REFLEX MICROSCOPIC
Bilirubin Urine: NEGATIVE
Glucose, UA: NEGATIVE mg/dL
Ketones, ur: NEGATIVE mg/dL
Nitrite: NEGATIVE
Protein, ur: NEGATIVE mg/dL
Specific Gravity, Urine: 1.021 (ref 1.005–1.030)
pH: 6 (ref 5.0–8.0)

## 2015-09-12 LAB — URINE MICROSCOPIC-ADD ON

## 2015-09-12 LAB — COMPREHENSIVE METABOLIC PANEL
ALT: 18 U/L (ref 14–54)
AST: 22 U/L (ref 15–41)
Albumin: 4 g/dL (ref 3.5–5.0)
Alkaline Phosphatase: 104 U/L (ref 38–126)
Anion gap: 12 (ref 5–15)
BUN: 11 mg/dL (ref 6–20)
CO2: 28 mmol/L (ref 22–32)
Calcium: 9.5 mg/dL (ref 8.9–10.3)
Chloride: 101 mmol/L (ref 101–111)
Creatinine, Ser: 0.79 mg/dL (ref 0.44–1.00)
GFR calc Af Amer: >60 mL/min (ref >60)
GFR calc non Af Amer: >60 mL/min (ref >60)
Glucose, Bld: 109 mg/dL — ABNORMAL HIGH (ref 65–99)
Potassium: 3.7 mmol/L (ref 3.5–5.1)
Sodium: 141 mmol/L (ref 135–145)
Total Bilirubin: 0.6 mg/dL (ref 0.3–1.2)
Total Protein: 7.7 g/dL (ref 6.5–8.1)

## 2015-09-12 LAB — CBC
HCT: 39.8 % (ref 36.0–46.0)
Hemoglobin: 12.6 g/dL (ref 12.0–15.0)
MCH: 26.8 pg (ref 26.0–34.0)
MCHC: 31.7 g/dL (ref 30.0–36.0)
MCV: 84.7 fL (ref 78.0–100.0)
Platelets: 298 10*3/uL (ref 150–400)
RBC: 4.7 MIL/uL (ref 3.87–5.11)
RDW: 13.4 % (ref 11.5–15.5)
WBC: 9.7 10*3/uL (ref 4.0–10.5)

## 2015-09-12 LAB — POC URINE PREG, ED: Preg Test, Ur: NEGATIVE

## 2015-09-12 MED ORDER — MORPHINE SULFATE (PF) 4 MG/ML IV SOLN
4.0000 mg | Freq: Once | INTRAVENOUS | Status: AC
  Administered 2015-09-12: 4 mg via INTRAVENOUS
  Filled 2015-09-12: qty 1

## 2015-09-12 MED ORDER — FENTANYL CITRATE (PF) 100 MCG/2ML IJ SOLN
50.0000 ug | Freq: Once | INTRAMUSCULAR | Status: DC
  Filled 2015-09-12: qty 2

## 2015-09-12 MED ORDER — OXYCODONE-ACETAMINOPHEN 5-325 MG PO TABS: 1.0000 | ORAL_TABLET | Freq: Four times a day (QID) | ORAL | Status: DC | PRN

## 2015-09-12 MED ORDER — ONDANSETRON 4 MG PO TBDP: 4.0000 mg | ORAL_TABLET | Freq: Three times a day (TID) | ORAL | Status: DC | PRN

## 2015-09-12 MED ORDER — ONDANSETRON HCL 4 MG/2ML IJ SOLN
4.0000 mg | Freq: Once | INTRAMUSCULAR | Status: AC
  Administered 2015-09-12: 4 mg via INTRAVENOUS
  Filled 2015-09-12: qty 2

## 2015-09-12 MED ORDER — NAPROXEN 500 MG PO TABS: 500.0000 mg | ORAL_TABLET | Freq: Two times a day (BID) | ORAL | Status: DC | PRN

## 2015-09-12 MED ORDER — TAMSULOSIN HCL 0.4 MG PO CAPS: 0.4000 mg | ORAL_CAPSULE | Freq: Every day | ORAL | Status: DC

## 2015-09-12 MED ORDER — FENTANYL CITRATE (PF) 100 MCG/2ML IJ SOLN
100.0000 ug | Freq: Once | INTRAMUSCULAR | Status: AC
  Administered 2015-09-12: 100 ug via INTRAVENOUS

## 2015-09-12 NOTE — ED Provider Notes (Signed)
CSN: [id number]     Arrival date & time 09/12/15  0027 History   First MD Initiated Contact with Patient 09/12/15 [phone]     Chief Complaint  Patient presents with  . Back Pain  . Hematuria     (Consider location/radiation/quality/duration/timing/severity/associated sxs/prior Treatment) HPI Comments: Emily Silva is a 25 y.o. female with a PMHx of nephrolithiasis, anxiety, and ADHD, with a PSHx of cystoscopy with ureteroscopy and stenting on the R side, who presents to the ED with complaints of right flank pain onset 2 AM which feels very similar to her prior kidney stones. She describes the pain is 9/10 intermittent stabbing in the right flank radiating into the lateral aspect of the right side of her abdomen along the mid axillary line, worse with sitting, with no treatments tried prior to arrival. Associated symptoms include nausea, 4 episodes of nonbloody nonbilious emesis, throbbing dysuria, hematuria, and urinary frequency and urgency. She called her urologist Dr. Irene Pap and Dr. Retta Diones last night, who made an appt to see her early this coming week, but could not call her in any medications over the phone. She states that while she was waiting to be seen, when she used the restroom she felt like parts of the stone may have passed.  She denies any fevers, chills, chest pain, shortness breath, abdominal pain, hematemesis, diarrhea, constipation, melena, hematochezia, obstipation, vaginal bleeding or discharge, numbness, tingling, weakness, recent travel, sick contacts, suspicious food intake, alcohol use, or chronic NSAID use. LMP was 2.5 weeks ago.  Patient is a 25 y.o. female presenting with back pain, hematuria, and flank pain. The history is provided by the patient and medical records. No language interpreter was used.  Back Pain Associated symptoms: dysuria   Associated symptoms: no abdominal pain, no chest pain, no fever, no numbness and no weakness   Hematuria Associated symptoms  include nausea, urinary symptoms and vomiting. Pertinent negatives include no abdominal pain, arthralgias, chest pain, chills, fever, myalgias, numbness or weakness.  Flank Pain This is a recurrent problem. The current episode started today. The problem occurs intermittently. The problem has been unchanged. Associated symptoms include nausea, urinary symptoms and vomiting. Pertinent negatives include no abdominal pain, arthralgias, chest pain, chills, fever, myalgias, numbness or weakness. Exacerbated by: sitting. She has tried nothing for the symptoms. The treatment provided no relief.    Past Medical History  Diagnosis Date  . ADHD (attention deficit hyperactivity disorder)   . Anxiety disorder   . Right ureteral stone   . Hematuria   . Frequency of urination   . History of kidney stones   . Renal disorder     Kidney stones   Past Surgical History  Procedure Laterality Date  . Wisdom tooth extraction    . Cystoscopy with ureteroscopy and stent placement Right 03/17/2013    Procedure: CYSTOSCOPY WITH RIGHT URETEROSCOPY HOLMIUM LASER OF STONESER LITHOTRIPSY AND STENT PLACEMENT;  Surgeon: Garnett Farm, MD;  Location: [facility];  Service: Urology;  Laterality: Right;   Family History  Problem Relation Age of Onset  . Cancer Father    Social History  Substance Use Topics  . Smoking status: Former Smoker    Types: Cigarettes    Quit date: 12/31/2013  . Smokeless tobacco: Never Used     Comment: occasional smoker of cigarettes and e-cig since 2012  . Alcohol Use: No     Comment: occasional   OB History    No data available  Review of Systems  Constitutional: Negative for fever and chills.  Respiratory: Negative for shortness of breath.   Cardiovascular: Negative for chest pain.  Gastrointestinal: Positive for nausea and vomiting. Negative for abdominal pain, diarrhea, constipation and blood in stool.  Genitourinary: Positive for dysuria, urgency,  frequency, hematuria and flank pain. Negative for vaginal bleeding and vaginal discharge.  Musculoskeletal: Positive for back pain. Negative for myalgias and arthralgias.  Skin: Negative for color change.  Allergic/Immunologic: Negative for immunocompromised state.  Neurological: Negative for weakness and numbness.  Psychiatric/Behavioral: Negative for confusion.   10 Systems reviewed and are negative for acute change except as noted in the HPI.    Allergies  Nickel  Home Medications   Prior to Admission medications   Medication Sig Start Date End Date Taking? Authorizing Provider  ALPRAZolam Prudy Feeler) 1 MG tablet Take 1 mg by mouth 3 (three) times daily as needed for anxiety.   Yes Historical Provider, MD  amphetamine-dextroamphetamine (ADDERALL) 10 MG tablet Take 10 mg by mouth daily with breakfast.   Yes Historical Provider, MD  buprenorphine (SUBUTEX) 8 MG SUBL SL tablet Place 8-12 mg under the tongue daily.  10/23/14  Yes Historical Provider, MD  ciprofloxacin (CIPRO) 500 MG tablet Take 1 tablet (500 mg total) by mouth 2 (two) times daily. To complete 10d course Patient not taking: Reported on 09/12/2015 03/15/15   Tonye Pearson, MD  docusate sodium (COLACE) 100 MG capsule Take 1 capsule (100 mg total) by mouth daily as needed for mild constipation or moderate constipation. Patient not taking: Reported on 09/12/2015 03/08/15   Trixie Dredge, PA-C  ondansetron (ZOFRAN) 4 MG tablet Take 1 tablet (4 mg total) by mouth every 8 (eight) hours as needed for nausea or vomiting. Patient not taking: Reported on 09/12/2015 03/12/15   Emilia Beck, PA-C  oxyCODONE-acetaminophen (PERCOCET/ROXICET) 5-325 MG per tablet Take 1-2 tablets by mouth every 6 (six) hours as needed for severe pain. Patient not taking: Reported on 09/12/2015 03/15/15   Tonye Pearson, MD  promethazine (PHENERGAN) 25 MG tablet Take 1 tablet (25 mg total) by mouth every 6 (six) hours as needed for nausea or vomiting. Patient  not taking: Reported on 09/12/2015 03/15/15   Tonye Pearson, MD  tamsulosin (FLOMAX) 0.4 MG CAPS capsule Take 1 capsule (0.4 mg total) by mouth daily. Patient not taking: Reported on 09/12/2015 03/08/15   Trixie Dredge, PA-C   BP 125/91 mmHg  Pulse 81  Temp(Src) 98.3 F (36.8 C) (Oral)  Resp 20  Ht 5\' 11"  (1.803 m)  Wt 101.152 kg  BMI 31.12 kg/m2  SpO2 100%  LMP 08/29/2015 (Approximate) Physical Exam  Constitutional: She is oriented to person, place, and time. Vital signs are normal. She appears well-developed and well-nourished.  Non-toxic appearance. No distress.  Afebrile, nontoxic, NAD  HENT:  Head: Normocephalic and atraumatic.  Mouth/Throat: Oropharynx is clear and moist and mucous membranes are normal.  Eyes: Conjunctivae and EOM are normal. Right eye exhibits no discharge. Left eye exhibits no discharge.  Neck: Normal range of motion. Neck supple.  Cardiovascular: Normal rate, regular rhythm, normal heart sounds and intact distal pulses.  Exam reveals no gallop and no friction rub.   No murmur heard. Pulmonary/Chest: Effort normal and breath sounds normal. No respiratory distress. She has no decreased breath sounds. She has no wheezes. She has no rhonchi. She has no rales.  Abdominal: Soft. Normal appearance and bowel sounds are normal. She exhibits no distension. There is no tenderness. There is  CVA tenderness. There is no rigidity, no rebound, no guarding, no tenderness at McBurney's point and negative Murphy's sign.  Soft, NTND, +BS throughout, no r/g/r, neg murphy's, neg mcburney's, +mild R sided CVA TTP   Musculoskeletal: Normal range of motion.  Neurological: She is alert and oriented to person, place, and time. She has normal strength. No sensory deficit.  Skin: Skin is warm, dry and intact. No rash noted.  Psychiatric: She has a normal mood and affect.  Nursing note and vitals reviewed.   ED Course  Procedures (including critical care time) Labs Review Labs Reviewed   COMPREHENSIVE METABOLIC PANEL - Abnormal; Notable for the following:    Glucose, Bld 109 (*)    All other components within normal limits  URINALYSIS, ROUTINE W REFLEX MICROSCOPIC (NOT AT [facility]) - Abnormal; Notable for the following:    APPearance CLOUDY (*)    Hgb urine dipstick MODERATE (*)    Leukocytes, UA SMALL (*)    All other components within normal limits  URINE MICROSCOPIC-ADD ON - Abnormal; Notable for the following:    Squamous Epithelial / LPF 6-30 (*)    Bacteria, UA FEW (*)    All other components within normal limits  CBC  POC URINE PREG, ED    Imaging Review Dg Abd 1 View  09/12/2015  CLINICAL DATA:  Right flank pain. Nausea and vomiting. Chills. Personal history of kidney stones. EXAM: ABDOMEN - 1 VIEW COMPARISON:  Bilateral renal ultrasound 03/08/2015. CT of the abdomen and pelvis 10/26/2014. FINDINGS: The bowel gas pattern is normal. Small bilateral stones in both kidneys on CT are not evident by plain film radiographs. No distal stones are evident. The axial skeleton is within normal limits. IMPRESSION: 1. Negative AP view of the abdomen. 2. Small stones in both kidneys by CT are not evident on these plain film radiographs. Electronically Signed   By: Marin Roberts M.D.   On: 09/12/2015 09:11   US Renal  09/12/2015  CLINICAL DATA:  Right-sided flank pain. EXAM: RENAL / URINARY TRACT ULTRASOUND COMPLETE COMPARISON:  Renal ultrasound 03/08/2015 and CT abdomen and pelvis 4/25/ 16. FINDINGS: Right Kidney: Length: 12.1 cm, within normal limits. Echogenicity is normal. Previously seen nonobstructing stones are not clearly identified. There is no right-sided hydronephrosis or other mass lesion. Color Doppler evaluation is within normal limits. Left Kidney: Length: 12.3 cm, within normal limits. Echogenicity is normal. At least 1 nonobstructing stone is again seen, measuring 6 mm in the midportion of the left kidney. There is no hydronephrosis or other mass lesion. Bladder:  Appears normal for degree of bladder distention. IMPRESSION: 1. Nonobstructing 6 mm stone in the left kidney. 2. Other previously-seen bilateral kidney stones are not visualized. 3. No hydronephrosis or other mass lesion on either side. Electronically Signed   By: Marin Roberts M.D.   On: 09/12/2015 09:24   I have personally reviewed and evaluated these images and lab results as part of my medical decision-making.   EKG Interpretation None      MDM   Final diagnoses:  Nephrolithiasis  Flank pain  Non-intractable vomiting with nausea, vomiting of unspecified type  Hematuria    25 y.o. female here with R flank pain that feels consistent with her prior kidney stones. On exam, no abd tenderness, minimal R CVA TTP. U/A reveals moderate Hgb with 6-30 WBC, RBC, and squamous, but few bacteria. Likely contaminated and due to likely nephrolithiasis, doubt UTI/infected stone. Upreg neg. CMP WNL, CBC WNL. Discussed proceeding with CT  scan to see if this is a stone that could pass, and due to cost pt declined, but agreed to getting Abd xray and renal U/S to eval the size of stone and evidence of obstruction, which could change our management. Pain meds and nausea meds given. Will reassess shortly.   9:40 AM Renal U/S showing nonobstructing L kidney stone and no other findings; Abd xray with no evidence of stones that were previously seen by CT. Given symptoms, it seems consistent with stone, and perhaps she started to pass it while she was waiting since she had that sensation of grittiness when she urinated. Given that there are no obstructing stones, this is reassuring that any stones that may be present are small enough that they will pass. Pain initially improved but then worsened, ordered morphine but she didn't get it before she left for xray, so she will receive this now. Nausea better, will PO challenge. As long as she can tolerate PO then she can go home with pain meds, nausea meds, and flomax  and urine strainer, with instruction to f/up with urologist this wk. [facility] if intractable symptoms/worsening symptoms occurs.   11:08 AM Pain controlled, PO challenged and pt tolerating PO well. D/c home with previously discussed plan. I explained the diagnosis and have given explicit precautions to return to the ER including for any other new or worsening symptoms. The patient understands and accepts the medical plan as it's been dictated and I have answered their questions. Discharge instructions concerning home care and prescriptions have been given. The patient is STABLE and is discharged to home in good condition.  BP 121/89 mmHg  Pulse 90  Temp(Src) 98 F (36.7 C) (Oral)  Resp 18  Ht 5\' 11"  (1.803 m)  Wt 101.152 kg  BMI 31.12 kg/m2  SpO2 100%  LMP 08/29/2015 (Approximate)  Meds ordered this encounter  Medications  . fentaNYL (SUBLIMAZE) injection 50 mcg    Sig:   . ondansetron (ZOFRAN) injection 4 mg    Sig:   . fentaNYL (SUBLIMAZE) injection 100 mcg    Sig:   . morphine 4 MG/ML injection 4 mg    Sig:   . tamsulosin (FLOMAX) 0.4 MG CAPS capsule    Sig: Take 1 capsule (0.4 mg total) by mouth daily after supper.    Dispense:  15 capsule    Refill:  0    Order Specific Question:  Supervising Provider    Answer:  MILLER, BRIAN [[id number]]  . ondansetron (ZOFRAN ODT) 4 MG disintegrating tablet    Sig: Take 1 tablet (4 mg total) by mouth every 8 (eight) hours as needed for nausea or vomiting.    Dispense:  15 tablet    Refill:  0    Order Specific Question:  Supervising Provider    Answer:  MILLER, BRIAN [[id number]]  . naproxen (NAPROSYN) 500 MG tablet    Sig: Take 1 tablet (500 mg total) by mouth 2 (two) times daily as needed for mild pain, moderate pain or headache (TAKE WITH MEALS.).    Dispense:  20 tablet    Refill:  0    Order Specific Question:  Supervising Provider    Answer:  MILLER, BRIAN [[id number]]  . oxyCODONE-acetaminophen (PERCOCET) 5-325 MG tablet    Sig: Take  1 tablet by mouth every 6 (six) hours as needed for severe pain.    Dispense:  20 tablet    Refill:  0    Order Specific Question:  Supervising  Provider    Answer:  Eber Hong [address]pr[address], PA-C 09/12/15 1109  Lorre Nick, MD 09/12/15 1511

## 2015-09-12 NOTE — ED Notes (Signed)
Pt. reports right lower back pain with hematuria and emesis this evening , denies fever or chills. Pt. stated history of kidney stone .

## 2015-09-12 NOTE — ED Notes (Signed)
MD PA Mercedes at the bedside

## 2015-09-12 NOTE — Discharge Instructions (Signed)
Take naprosyn as directed as needed for pain using percocet for breakthrough pain. Do not drive or operate machinery with pain medication use. May need over-the-counter stool softener with this pain medication use. Use Zofran as needed for nausea. Use Flomax as directed, as this medication will help you pass the stone. Strain all urine to try to catch the stone when it passes. Follow-up with your urologist in the next 3-5 days for recheck of ongoing pain, however for intractable or uncontrollable pain at home then return to the [facility] emergency department.     Flank Pain Flank pain is pain in your side. The flank is the area of your side between your upper belly (abdomen) and your back. Pain in this area can be caused by many different things. HOME CARE Home care and treatment will depend on the cause of your pain.  Rest as told by your doctor.  Drink enough fluids to keep your pee (urine) clear or pale yellow.  Only take medicine as told by your doctor.  Tell your doctor about any changes in your pain.  Follow up with your doctor. GET HELP RIGHT AWAY IF:   Your pain does not get better with medicine.   You have new symptoms or your symptoms get worse.  Your pain gets worse.   You have belly (abdominal) pain.   You are short of breath.   You always feel sick to your stomach (nauseous).   You keep throwing up (vomiting).   You have puffiness (swelling) in your belly.   You feel light-headed or you pass out (faint).   You have blood in your pee.  You have a fever or lasting symptoms for more than 2-3 days.  You have a fever and your symptoms suddenly get worse. MAKE SURE YOU:   Understand these instructions.  Will watch your condition.  Will get help right away if you are not doing well or get worse.   This information is not intended to replace advice given to you by your health care provider. Make sure you discuss any questions you have with your  health care provider.   Document Released: 03/28/2008 Document Revised: 07/10/2014 Document Reviewed: 02/01/2012 Elsevier Interactive Patient Education 2016 Elsevier Inc.  Dietary Guidelines to Help Prevent Kidney Stones Your risk of kidney stones can be decreased by adjusting the foods you eat. The most important thing you can do is drink enough fluid. You should drink enough fluid to keep your urine clear or pale yellow. The following guidelines provide specific information for the type of kidney stone you have had. GUIDELINES ACCORDING TO TYPE OF KIDNEY STONE Calcium Oxalate Kidney Stones  Reduce the amount of salt you eat. Foods that have a lot of salt cause your body to release excess calcium into your urine. The excess calcium can combine with a substance called oxalate to form kidney stones.  Reduce the amount of animal protein you eat if the amount you eat is excessive. Animal protein causes your body to release excess calcium into your urine. Ask your dietitian how much protein from animal sources you should be eating.  Avoid foods that are high in oxalates. If you take vitamins, they should have less than 500 mg of vitamin C. Your body turns vitamin C into oxalates. You do not need to avoid fruits and vegetables high in vitamin C. Calcium Phosphate Kidney Stones  Reduce the amount of salt you eat to help prevent the release of excess calcium into your  urine.  Reduce the amount of animal protein you eat if the amount you eat is excessive. Animal protein causes your body to release excess calcium into your urine. Ask your dietitian how much protein from animal sources you should be eating.  Get enough calcium from food or take a calcium supplement (ask your dietitian for recommendations). Food sources of calcium that do not increase your risk of kidney stones include:  Broccoli.  Dairy products, such as cheese and yogurt.  Pudding. Uric Acid Kidney Stones  Do not have more than  6 oz of animal protein per day. FOOD SOURCES Animal Protein Sources  Meat (all types).  Poultry.  Eggs.  Fish, seafood. Foods High in MirantSalt  Salt seasonings.  Soy sauce.  Teriyaki sauce.  Cured and processed meats.  Salted crackers and snack foods.  Fast food.  Canned soups and most canned foods. Foods High in Oxalates  Grains:  Amaranth.  Barley.  Grits.  Wheat germ.  Bran.  Buckwheat flour.  All bran cereals.  Pretzels.  Whole wheat bread.  Vegetables:  Beans (wax).  Beets and beet greens.  Collard greens.  Eggplant.  Escarole.  Leeks.  Okra.  Parsley.  Rutabagas.  Spinach.  Swiss chard.  Tomato paste.  Fried potatoes.  Sweet potatoes.  Fruits:  Red currants.  Figs.  Kiwi.  Rhubarb.  Meat and Other Protein Sources:  Beans (dried).  Soy burgers and other soybean products.  Miso.  Nuts (peanuts, almonds, pecans, cashews, hazelnuts).  Nut butters.  Sesame seeds and tahini (paste made of sesame seeds).  Poppy seeds.  Beverages:  Chocolate drink mixes.  Soy milk.  Instant iced tea.  Juices made from high-oxalate fruits or vegetables.  Other:  Carob.  Chocolate.  Fruitcake.  Marmalades.   This information is not intended to replace advice given to you by your health care provider. Make sure you discuss any questions you have with your health care provider.   Document Released: 10/14/2010 Document Revised: 06/24/2013 Document Reviewed: 05/16/2013 Elsevier Interactive Patient Education 2016 Elsevier Inc.  Hematuria, Adult Hematuria is blood in your urine. It can be caused by a bladder infection, kidney infection, prostate infection, kidney stone, or cancer of your urinary tract. Infections can usually be treated with medicine, and a kidney stone usually will pass through your urine. If neither of these is the cause of your hematuria, further workup to find out the reason may be needed. It is  very important that you tell your health care provider about any blood you see in your urine, even if the blood stops without treatment or happens without causing pain. Blood in your urine that happens and then stops and then happens again can be a symptom of a very serious condition. Also, pain is not a symptom in the initial stages of many urinary cancers. HOME CARE INSTRUCTIONS   Drink lots of fluid, 3-4 quarts a day. If you have been diagnosed with an infection, cranberry juice is especially recommended, in addition to large amounts of water.  Avoid caffeine, tea, and carbonated beverages because they tend to irritate the bladder.  Avoid alcohol because it may irritate the prostate.  Take all medicines as directed by your health care provider.  If you were prescribed an antibiotic medicine, finish it all even if you start to feel better.  If you have been diagnosed with a kidney stone, follow your health care provider's instructions regarding straining your urine to catch the stone.  Empty your bladder  often. Avoid holding urine for long periods of time.  After a bowel movement, women should cleanse front to back. Use each tissue only once.  Empty your bladder before and after sexual intercourse if you are a female. SEEK MEDICAL CARE IF:  You develop back pain.  You have a fever.  You have a feeling of sickness in your stomach (nausea) or vomiting.  Your symptoms are not better in 3 days. Return sooner if you are getting worse. SEEK IMMEDIATE MEDICAL CARE IF:   You develop severe vomiting and are unable to keep the medicine down.  You develop severe back or abdominal pain despite taking your medicines.  You begin passing a large amount of blood or clots in your urine.  You feel extremely weak or faint, or you pass out. MAKE SURE YOU:   Understand these instructions.  Will watch your condition.  Will get help right away if you are not doing well or get worse.   This  information is not intended to replace advice given to you by your health care provider. Make sure you discuss any questions you have with your health care provider.   Document Released: 06/19/2005 Document Revised: 07/10/2014 Document Reviewed: 02/17/2013 Elsevier Interactive Patient Education 2016 Elsevier Inc.  Kidney Stones Kidney stones (urolithiasis) are solid masses that form inside your kidneys. The intense pain is caused by the stone moving through the kidney, ureter, bladder, and urethra (urinary tract). When the stone moves, the ureter starts to spasm around the stone. The stone is usually passed in your pee (urine).  HOME CARE  Drink enough fluids to keep your pee clear or pale yellow. This helps to get the stone out.  Take a 24-hour pee (urine) sample as told by your doctor. You may need to take another sample every 6-12 months.  Strain all pee through the provided strainer. Do not pee without peeing through the strainer, not even once. If you pee the stone out, catch it in the strainer. The stone may be as small as a grain of salt. Take this to your doctor. This will help your doctor figure out what you can do to try to prevent more kidney stones.  Only take medicine as told by your doctor.  Make changes to your daily diet as told by your doctor. You may be told to:  Limit how much salt you eat.  Eat 5 or more servings of fruits and vegetables each day.  Limit how much meat, poultry, fish, and eggs you eat.  Keep all follow-up visits as told by your doctor. This is important.  Get follow-up X-rays as told by your doctor. GET HELP IF: You have pain that gets worse even if you have been taking pain medicine. GET HELP RIGHT AWAY IF:   Your pain does not get better with medicine.  You have a fever or shaking chills.  Your pain increases and gets worse over 18 hours.  You have new belly (abdominal) pain.  You feel faint or pass out.  You are unable to pee.   This  information is not intended to replace advice given to you by your health care provider. Make sure you discuss any questions you have with your health care provider.   Document Released: 12/06/2007 Document Revised: 03/10/2015 Document Reviewed: 11/20/2012 Elsevier Interactive Patient Education 2016 Elsevier Inc.  Nausea and Vomiting Nausea is a sick feeling that often comes before throwing up (vomiting). Vomiting is a reflex where stomach contents come out  of your mouth. Vomiting can cause severe loss of body fluids (dehydration). Children and elderly adults can become dehydrated quickly, especially if they also have diarrhea. Nausea and vomiting are symptoms of a condition or disease. It is important to find the cause of your symptoms. CAUSES   Direct irritation of the stomach lining. This irritation can result from increased acid production (gastroesophageal reflux disease), infection, food poisoning, taking certain medicines (such as nonsteroidal anti-inflammatory drugs), alcohol use, or tobacco use.  Signals from the brain.These signals could be caused by a headache, heat exposure, an inner ear disturbance, increased pressure in the brain from injury, infection, a tumor, or a concussion, pain, emotional stimulus, or metabolic problems.  An obstruction in the gastrointestinal tract (bowel obstruction).  Illnesses such as diabetes, hepatitis, gallbladder problems, appendicitis, kidney problems, cancer, sepsis, atypical symptoms of a heart attack, or eating disorders.  Medical treatments such as chemotherapy and radiation.  Receiving medicine that makes you sleep (general anesthetic) during surgery. DIAGNOSIS Your caregiver may ask for tests to be done if the problems do not improve after a few days. Tests may also be done if symptoms are severe or if the reason for the nausea and vomiting is not clear. Tests may include:  Urine tests.  Blood tests.  Stool tests.  Cultures (to look  for evidence of infection).  X-rays or other imaging studies. Test results can help your caregiver make decisions about treatment or the need for additional tests. TREATMENT You need to stay well hydrated. Drink frequently but in small amounts.You may wish to drink water, sports drinks, clear broth, or eat frozen ice pops or gelatin dessert to help stay hydrated.When you eat, eating slowly may help prevent nausea.There are also some antinausea medicines that may help prevent nausea. HOME CARE INSTRUCTIONS   Take all medicine as directed by your caregiver.  If you do not have an appetite, do not force yourself to eat. However, you must continue to drink fluids.  If you have an appetite, eat a normal diet unless your caregiver tells you differently.  Eat a variety of complex carbohydrates (rice, wheat, potatoes, bread), lean meats, yogurt, fruits, and vegetables.  Avoid high-fat foods because they are more difficult to digest.  Drink enough water and fluids to keep your urine clear or pale yellow.  If you are dehydrated, ask your caregiver for specific rehydration instructions. Signs of dehydration may include:  Severe thirst.  Dry lips and mouth.  Dizziness.  Dark urine.  Decreasing urine frequency and amount.  Confusion.  Rapid breathing or pulse. SEEK IMMEDIATE MEDICAL CARE IF:   You have blood or brown flecks (like coffee grounds) in your vomit.  You have black or bloody stools.  You have a severe headache or stiff neck.  You are confused.  You have severe abdominal pain.  You have chest pain or trouble breathing.  You do not urinate at least once every 8 hours.  You develop cold or clammy skin.  You continue to vomit for longer than 24 to 48 hours.  You have a fever. MAKE SURE YOU:   Understand these instructions.  Will watch your condition.  Will get help right away if you are not doing well or get worse.   This information is not intended to  replace advice given to you by your health care provider. Make sure you discuss any questions you have with your health care provider.   Document Released: 06/19/2005 Document Revised: 09/11/2011 Document Reviewed: 11/16/2010 Elsevier  Interactive Patient Education 2016 Elsevier Inc.  Renal Colic Renal colic is pain that is caused by passing a kidney stone. The pain can be sharp and severe. It may be felt in the back, abdomen, side (flank), or groin. It can cause nausea. Renal colic can come and go. HOME CARE INSTRUCTIONS Watch your condition for any changes. The following actions may help to lessen any discomfort that you are feeling:  Take medicines only as directed by your health care provider.  Ask your health care provider if it is okay to take over-the-counter pain medicine.  Drink enough fluid to keep your urine clear or pale yellow. Drink 6-8 glasses of water each day.  Limit the amount of salt that you eat to less than 2 grams per day.  Reduce the amount of protein in your diet. Eat less meat, fish, nuts, and dairy.  Avoid foods such as spinach, rhubarb, nuts, or bran. These may make kidney stones more likely to form. SEEK MEDICAL CARE IF:  You have a fever or chills.  Your urine smells bad or looks cloudy.  You have pain or burning when you pass urine. SEEK IMMEDIATE MEDICAL CARE IF:  Your flank pain or groin pain suddenly worsens.  You become confused or disoriented or you lose consciousness.   This information is not intended to replace advice given to you by your health care provider. Make sure you discuss any questions you have with your health care provider.   Document Released: 03/29/2005 Document Revised: 07/10/2014 Document Reviewed: 04/29/2014 Elsevier Interactive Patient Education [employer]

## 2015-09-12 NOTE — ED Notes (Signed)
PA at the bedside.

## 2015-09-12 NOTE — ED Notes (Signed)
Pt was gone to Xray when this RN tried to give medication

## 2015-10-16 ENCOUNTER — Encounter (HOSPITAL_COMMUNITY): Payer: Self-pay | Admitting: Emergency Medicine

## 2015-10-16 ENCOUNTER — Ambulatory Visit (HOSPITAL_COMMUNITY)
Admission: EM | Admit: 2015-10-16 | Discharge: 2015-10-16 | Disposition: A | Discharge status: Home / Self Care | Payer: Managed Care, Other (non HMO) | Attending: Family Medicine | Admitting: Family Medicine

## 2015-10-16 DIAGNOSIS — F419 Anxiety disorder, unspecified: Secondary | ICD-10-CM | POA: Insufficient documentation

## 2015-10-16 DIAGNOSIS — R5383 Other fatigue: Secondary | ICD-10-CM | POA: Insufficient documentation

## 2015-10-16 DIAGNOSIS — R51 Headache: Secondary | ICD-10-CM | POA: Diagnosis not present

## 2015-10-16 DIAGNOSIS — J069 Acute upper respiratory infection, unspecified: Secondary | ICD-10-CM | POA: Insufficient documentation

## 2015-10-16 DIAGNOSIS — J029 Acute pharyngitis, unspecified: Secondary | ICD-10-CM | POA: Diagnosis present

## 2015-10-16 DIAGNOSIS — Z79899 Other long term (current) drug therapy: Secondary | ICD-10-CM | POA: Insufficient documentation

## 2015-10-16 DIAGNOSIS — Z888 Allergy status to other drugs, medicaments and biological substances status: Secondary | ICD-10-CM | POA: Diagnosis not present

## 2015-10-16 DIAGNOSIS — Z87891 Personal history of nicotine dependence: Secondary | ICD-10-CM | POA: Diagnosis not present

## 2015-10-16 DIAGNOSIS — Z9889 Other specified postprocedural states: Secondary | ICD-10-CM | POA: Insufficient documentation

## 2015-10-16 DIAGNOSIS — Z87442 Personal history of urinary calculi: Secondary | ICD-10-CM | POA: Insufficient documentation

## 2015-10-16 DIAGNOSIS — R350 Frequency of micturition: Secondary | ICD-10-CM | POA: Insufficient documentation

## 2015-10-16 DIAGNOSIS — F909 Attention-deficit hyperactivity disorder, unspecified type: Secondary | ICD-10-CM | POA: Diagnosis not present

## 2015-10-16 LAB — POCT RAPID STREP A: Streptococcus, Group A Screen (Direct): NEGATIVE

## 2015-10-16 MED ORDER — HYDROCOD POLST-CPM POLST ER 10-8 MG/5ML PO SUER: 5.0000 mL | Freq: Two times a day (BID) | ORAL | Status: DC | PRN

## 2015-10-16 MED ORDER — BENZONATATE 100 MG PO CAPS: 100.0000 mg | ORAL_CAPSULE | Freq: Three times a day (TID) | ORAL | Status: DC | PRN

## 2015-10-16 NOTE — ED Notes (Signed)
The patient presented to the [facility] with a complaint of a sore throat x 5 days.  

## 2015-10-16 NOTE — Discharge Instructions (Signed)

## 2015-10-16 NOTE — ED Provider Notes (Signed)
CSN: [id number]     Arrival date & time 10/16/15  1918 History   First MD Initiated Contact with Patient 10/16/15 2052     Chief Complaint  Patient presents with  . Sore Throat   (Consider location/radiation/quality/duration/timing/severity/associated sxs/prior Treatment) Patient is a 25 y.o. female presenting with pharyngitis. The history is provided by the patient. No language interpreter was used.  Sore Throat This is a new problem. Episode onset: 4 days ago. The problem occurs constantly. The problem has been gradually worsening. Associated symptoms include headaches. Pertinent negatives include no chest pain, no abdominal pain and no shortness of breath. Associated symptoms comments: Fatigue,coughing at night , headache. Nothing aggravates the symptoms. Nothing relieves the symptoms. Treatments tried: Flonase, Tylenol, OTC cough regimen. The treatment provided no relief.  She had similar episode years back and was given some oral gel which helped in addition to tussionex. She also asked for refill of tessalon pearl which she had used in the past.  Past Medical History  Diagnosis Date  . ADHD (attention deficit hyperactivity disorder)   . Anxiety disorder   . Right ureteral stone   . Hematuria   . Frequency of urination   . History of kidney stones   . Renal disorder     Kidney stones   Past Surgical History  Procedure Laterality Date  . Wisdom tooth extraction    . Cystoscopy with ureteroscopy and stent placement Right 03/17/2013    Procedure: CYSTOSCOPY WITH RIGHT URETEROSCOPY HOLMIUM LASER OF STONESER LITHOTRIPSY AND STENT PLACEMENT;  Surgeon: Garnett Farm, MD;  Location: [facility];  Service: Urology;  Laterality: Right;   Family History  Problem Relation Age of Onset  . Cancer Father    Social History  Substance Use Topics  . Smoking status: Former Smoker    Types: Cigarettes    Quit date: 12/31/2013  . Smokeless tobacco: Never Used     Comment:  occasional smoker of cigarettes and e-cig since 2012  . Alcohol Use: No     Comment: occasional   OB History    No data available     Review of Systems  HENT: Positive for sore throat. Negative for ear pain, rhinorrhea and sinus pressure.   Respiratory: Positive for cough. Negative for chest tightness, shortness of breath and wheezing.   Cardiovascular: Negative.  Negative for chest pain.  Gastrointestinal: Negative for abdominal pain.  Neurological: Positive for headaches.  All other systems reviewed and are negative.   Allergies  Nickel  Home Medications   Prior to Admission medications   Medication Sig Start Date End Date Taking? Authorizing Provider  ALPRAZolam Prudy Feeler) 1 MG tablet Take 1 mg by mouth 3 (three) times daily as needed for anxiety.   Yes Historical Provider, MD  amphetamine-dextroamphetamine (ADDERALL) 10 MG tablet Take 10 mg by mouth daily with breakfast.   Yes Historical Provider, MD  buprenorphine (SUBUTEX) 8 MG SUBL SL tablet Place 8-12 mg under the tongue daily.  10/23/14  Yes Historical Provider, MD  ciprofloxacin (CIPRO) 500 MG tablet Take 1 tablet (500 mg total) by mouth 2 (two) times daily. To complete 10d course Patient not taking: Reported on 09/12/2015 03/15/15   Tonye Pearson, MD  docusate sodium (COLACE) 100 MG capsule Take 1 capsule (100 mg total) by mouth daily as needed for mild constipation or moderate constipation. Patient not taking: Reported on 09/12/2015 03/08/15   Trixie Dredge, PA-C  naproxen (NAPROSYN) 500 MG tablet Take 1 tablet (500 mg  total) by mouth 2 (two) times daily as needed for mild pain, moderate pain or headache (TAKE WITH MEALS.). 09/12/15   Mercedes Camprubi-Soms, PA-C  ondansetron (ZOFRAN ODT) 4 MG disintegrating tablet Take 1 tablet (4 mg total) by mouth every 8 (eight) hours as needed for nausea or vomiting. 09/12/15   Mercedes Camprubi-Soms, PA-C  ondansetron (ZOFRAN) 4 MG tablet Take 1 tablet (4 mg total) by mouth every 8 (eight)  hours as needed for nausea or vomiting. Patient not taking: Reported on 09/12/2015 03/12/15   Emilia BeckKaitlyn Szekalski, PA-C  oxyCODONE-acetaminophen (PERCOCET) 5-325 MG tablet Take 1 tablet by mouth every 6 (six) hours as needed for severe pain. 09/12/15   Mercedes Camprubi-Soms, PA-C  oxyCODONE-acetaminophen (PERCOCET/ROXICET) 5-325 MG per tablet Take 1-2 tablets by mouth every 6 (six) hours as needed for severe pain. Patient not taking: Reported on 09/12/2015 03/15/15   Tonye Pearsonobert P Doolittle, MD  promethazine (PHENERGAN) 25 MG tablet Take 1 tablet (25 mg total) by mouth every 6 (six) hours as needed for nausea or vomiting. Patient not taking: Reported on 09/12/2015 03/15/15   Tonye Pearsonobert P Doolittle, MD  tamsulosin (FLOMAX) 0.4 MG CAPS capsule Take 1 capsule (0.4 mg total) by mouth daily. Patient not taking: Reported on 09/12/2015 03/08/15   Trixie DredgeEmily West, PA-C  tamsulosin (FLOMAX) 0.4 MG CAPS capsule Take 1 capsule (0.4 mg total) by mouth daily after supper. 09/12/15   Mercedes Camprubi-Soms, PA-C   Meds Ordered and Administered this Visit  Medications - No data to display  BP 130/81 mmHg  Pulse 88  Temp(Src) 98.4 F (36.9 C) (Oral)  SpO2 100%  LMP 09/15/2015 (Approximate) No data found.   Physical Exam  Constitutional: She appears well-developed. No distress.  HENT:  Head: Normocephalic.  Right Ear: Ear canal normal.  Left Ear: Tympanic membrane, external ear and ear canal normal.  Mouth/Throat: Uvula is midline, oropharynx is clear and moist and mucous membranes are normal.  Cardiovascular: Normal rate, regular rhythm, normal heart sounds and intact distal pulses.   No murmur heard. Pulmonary/Chest: Effort normal and breath sounds normal. No respiratory distress. She has no wheezes.  Nursing note and vitals reviewed.   ED Course  Procedures (including critical care time)  Labs Review Labs Reviewed  POCT RAPID STREP A    Imaging Review No results found.   Visual Acuity Review  Right Eye  Distance:   Left Eye Distance:   Bilateral Distance:    Right Eye Near:   Left Eye Near:    Bilateral Near:         MDM  No diagnosis found. Pharyngitis  Upper respiratory infection  Patient appears clinically stable. Patient appears well. Temp as noted above. Exudative pharyngo-tonsillitis is negative. Anterior cervical nodes are absent.  Ears are normal, chest is clear.  Rapid strep test is negative. No rashes. No hepatosplenomegaly. Symptomatic treatment recommended for cough and congestion as well as pharyngitis. Tussionex prescribed prn cough and congestion. May also alternate with tessalone as needed for cough. Warm saline gurgle recommended. F/U as needed.     Doreene ElandKehinde T Lanyah Spengler, MD 10/16/15 2106

## 2015-10-18 ENCOUNTER — Telehealth (HOSPITAL_COMMUNITY): Payer: Self-pay | Admitting: Family Medicine

## 2015-10-18 LAB — CULTURE, GROUP A STREP (THRC)

## 2015-10-18 MED ORDER — PENICILLIN V POTASSIUM 500 MG PO TABS: 500.0000 mg | ORAL_TABLET | Freq: Two times a day (BID) | ORAL | Status: AC

## 2015-10-18 NOTE — Telephone Encounter (Signed)
Message left to call back about test result   Note: throat culture shows ABUNDANT STREPTOCOCCUS,BETA HEMOLYTIC NOT GROUP A I will treat with antibiotic. Penicillin e-scribed. If she calls back please give information about her prescription.

## 2015-12-15 ENCOUNTER — Emergency Department (HOSPITAL_COMMUNITY): Payer: Managed Care, Other (non HMO)

## 2015-12-15 ENCOUNTER — Emergency Department (HOSPITAL_COMMUNITY)
Admission: EM | Admit: 2015-12-15 | Discharge: 2015-12-15 | Disposition: A | Discharge status: Home / Self Care | Payer: Managed Care, Other (non HMO) | Attending: Emergency Medicine | Admitting: Emergency Medicine

## 2015-12-15 ENCOUNTER — Encounter (HOSPITAL_COMMUNITY): Payer: Self-pay | Admitting: Emergency Medicine

## 2015-12-15 DIAGNOSIS — Z79899 Other long term (current) drug therapy: Secondary | ICD-10-CM | POA: Diagnosis not present

## 2015-12-15 DIAGNOSIS — B9689 Other specified bacterial agents as the cause of diseases classified elsewhere: Secondary | ICD-10-CM | POA: Insufficient documentation

## 2015-12-15 DIAGNOSIS — Z87891 Personal history of nicotine dependence: Secondary | ICD-10-CM | POA: Insufficient documentation

## 2015-12-15 DIAGNOSIS — N898 Other specified noninflammatory disorders of vagina: Secondary | ICD-10-CM

## 2015-12-15 DIAGNOSIS — N390 Urinary tract infection, site not specified: Secondary | ICD-10-CM | POA: Diagnosis not present

## 2015-12-15 DIAGNOSIS — Z791 Long term (current) use of non-steroidal anti-inflammatories (NSAID): Secondary | ICD-10-CM | POA: Diagnosis not present

## 2015-12-15 DIAGNOSIS — R109 Unspecified abdominal pain: Secondary | ICD-10-CM

## 2015-12-15 DIAGNOSIS — R300 Dysuria: Secondary | ICD-10-CM | POA: Diagnosis present

## 2015-12-15 LAB — RAPID HIV SCREEN (HIV 1/2 AB+AG)
HIV 1/2 Antibodies: NONREACTIVE
HIV-1 P24 Antigen - HIV24: NONREACTIVE

## 2015-12-15 LAB — CBC WITH DIFFERENTIAL/PLATELET
Basophils Absolute: 0 10*3/uL (ref 0.0–0.1)
Basophils Relative: 0 %
Eosinophils Absolute: 0.3 10*3/uL (ref 0.0–0.7)
Eosinophils Relative: 2 %
HCT: 36 % (ref 36.0–46.0)
Hemoglobin: 11.4 g/dL — ABNORMAL LOW (ref 12.0–15.0)
Lymphocytes Relative: 23 %
Lymphs Abs: 2.6 10*3/uL (ref 0.7–4.0)
MCH: 27.3 pg (ref 26.0–34.0)
MCHC: 31.7 g/dL (ref 30.0–36.0)
MCV: 86.1 fL (ref 78.0–100.0)
Monocytes Absolute: 0.6 10*3/uL (ref 0.1–1.0)
Monocytes Relative: 5 %
Neutro Abs: 7.9 10*3/uL — ABNORMAL HIGH (ref 1.7–7.7)
Neutrophils Relative %: 70 %
Platelets: 341 10*3/uL (ref 150–400)
RBC: 4.18 MIL/uL (ref 3.87–5.11)
RDW: 14 % (ref 11.5–15.5)
WBC: 11.3 10*3/uL — ABNORMAL HIGH (ref 4.0–10.5)

## 2015-12-15 LAB — URINALYSIS, ROUTINE W REFLEX MICROSCOPIC
Bilirubin Urine: NEGATIVE
Glucose, UA: NEGATIVE mg/dL
Ketones, ur: NEGATIVE mg/dL
Leukocytes, UA: NEGATIVE
Nitrite: NEGATIVE
Protein, ur: NEGATIVE mg/dL
Specific Gravity, Urine: 1.02 (ref 1.005–1.030)
pH: 5.5 (ref 5.0–8.0)

## 2015-12-15 LAB — POC URINE PREG, ED: Preg Test, Ur: NEGATIVE

## 2015-12-15 LAB — HEPATIC FUNCTION PANEL
ALT: 18 U/L (ref 14–54)
AST: 18 U/L (ref 15–41)
Albumin: 3.7 g/dL (ref 3.5–5.0)
Alkaline Phosphatase: 100 U/L (ref 38–126)
Bilirubin, Direct: <0.1 mg/dL — ABNORMAL LOW (ref 0.1–0.5)
Total Bilirubin: 0.3 mg/dL (ref 0.3–1.2)
Total Protein: 7.5 g/dL (ref 6.5–8.1)

## 2015-12-15 LAB — URINE MICROSCOPIC-ADD ON

## 2015-12-15 LAB — WET PREP, GENITAL
Clue Cells Wet Prep HPF POC: NONE SEEN
Sperm: NONE SEEN
Trich, Wet Prep: NONE SEEN
Yeast Wet Prep HPF POC: NONE SEEN

## 2015-12-15 LAB — BASIC METABOLIC PANEL
Anion gap: 6 (ref 5–15)
BUN: 11 mg/dL (ref 6–20)
CO2: 27 mmol/L (ref 22–32)
Calcium: 9.7 mg/dL (ref 8.9–10.3)
Chloride: 105 mmol/L (ref 101–111)
Creatinine, Ser: 0.88 mg/dL (ref 0.44–1.00)
GFR calc Af Amer: >60 mL/min (ref >60)
GFR calc non Af Amer: >60 mL/min (ref >60)
Glucose, Bld: 91 mg/dL (ref 65–99)
Potassium: 3.8 mmol/L (ref 3.5–5.1)
Sodium: 138 mmol/L (ref 135–145)

## 2015-12-15 LAB — LIPASE, BLOOD: Lipase: 21 U/L (ref 11–51)

## 2015-12-15 LAB — RPR: RPR Ser Ql: NONREACTIVE

## 2015-12-15 MED ORDER — IBUPROFEN 800 MG PO TABS
800.0000 mg | ORAL_TABLET | Freq: Once | ORAL | Status: AC
  Administered 2015-12-15: 800 mg via ORAL
  Filled 2015-12-15: qty 1

## 2015-12-15 MED ORDER — CEPHALEXIN 500 MG PO CAPS: 500.0000 mg | ORAL_CAPSULE | Freq: Two times a day (BID) | ORAL | Status: DC

## 2015-12-15 MED ORDER — AZITHROMYCIN 250 MG PO TABS
1000.0000 mg | ORAL_TABLET | Freq: Once | ORAL | Status: AC
Start: 2015-12-15 — End: 2015-12-15
  Administered 2015-12-15: 1000 mg via ORAL
  Filled 2015-12-15: qty 4

## 2015-12-15 MED ORDER — OXYCODONE-ACETAMINOPHEN 5-325 MG PO TABS
1.0000 | ORAL_TABLET | Freq: Once | ORAL | Status: AC
  Administered 2015-12-15: 1 via ORAL
  Filled 2015-12-15: qty 1

## 2015-12-15 MED ORDER — FLUCONAZOLE 150 MG PO TABS: 150.0000 mg | ORAL_TABLET | Freq: Once | ORAL | Status: DC

## 2015-12-15 MED ORDER — HYDROCODONE-ACETAMINOPHEN 5-325 MG PO TABS: 1.0000 | ORAL_TABLET | ORAL | Status: DC | PRN

## 2015-12-15 MED ORDER — CEFTRIAXONE SODIUM 250 MG IJ SOLR
250.0000 mg | Freq: Once | INTRAMUSCULAR | Status: AC
  Administered 2015-12-15: 250 mg via INTRAMUSCULAR
  Filled 2015-12-15: qty 250

## 2015-12-15 MED ORDER — STERILE WATER FOR INJECTION IJ SOLN
INTRAMUSCULAR | Status: AC
  Filled 2015-12-15: qty 10

## 2015-12-15 NOTE — ED Notes (Addendum)
Pt reports L sided flank pain and dysuria. Also states vaginal discharge and nausea/vomiting. Ongoing x2 days.

## 2015-12-15 NOTE — ED Notes (Addendum)
Pt c/o painful urination x's 3-4 days.  Also left flank pain.  Vaginal discharge with itching xs 2 days.  Constipation x's 2 days.  Pt with multi. Complaints.  St's she also wants to be screened for STD's

## 2015-12-15 NOTE — ED Notes (Signed)
Pelvic cart set up at bedside  

## 2015-12-15 NOTE — Discharge Instructions (Signed)
Take the prescribed medication as directed.  May wish to use stool softener to help with constipation. Follow-up with your urologist if you continue having flank pain.  Follow-up with your OB-GYN if you continue having vaginal discharge/irritation.  You will be contacted if your STD screening results are abnormal. Return to the ED for new or worsening symptoms.

## 2015-12-15 NOTE — ED Provider Notes (Signed)
CSN: [id number]     Arrival date & time 12/15/15  0404 History   First MD Initiated Contact with Patient 12/15/15 [id number]     Chief Complaint  Patient presents with  . Abdominal Pain     (Consider location/radiation/quality/duration/timing/severity/associated sxs/prior Treatment) The history is provided by the patient and medical records.    25 year old female with history of ADHD, anxiety, kidney stones, presenting to the ED for multiple complaints.  Firstly, patient reports of dysuria for the past several days. She states pain is worse at the end of her urinary stream. She reports there is a burning sensation. She also reports some mild left flank pain. Secondly patient reports some white, vaginal discharge and vaginal itching for the past 2 days. She states she does have a genital piercing and is wondering if that has anything to do with her symptoms. She does report new sexual partner with concern for STD and would like STD screening today. She also complains of constipation for the past 2 days. She recently had plastic surgery on her nose and has been taking opiates so feels this is contributing. She did have a bowel movement yesterday but it was small. She denies any nausea, vomiting, or diarrhea. No fever or chills. No prior abdominal surgeries.  Past Medical History  Diagnosis Date  . ADHD (attention deficit hyperactivity disorder)   . Anxiety disorder   . Right ureteral stone   . Hematuria   . Frequency of urination   . History of kidney stones   . Renal disorder     Kidney stones   Past Surgical History  Procedure Laterality Date  . Wisdom tooth extraction    . Cystoscopy with ureteroscopy and stent placement Right 03/17/2013    Procedure: CYSTOSCOPY WITH RIGHT URETEROSCOPY HOLMIUM LASER OF STONESER LITHOTRIPSY AND STENT PLACEMENT;  Surgeon: Garnett Farm, MD;  Location: [facility];  Service: Urology;  Laterality: Right;  . Rhinoplasty     Family History   Problem Relation Age of Onset  . Cancer Father    Social History  Substance Use Topics  . Smoking status: Former Smoker    Types: Cigarettes    Quit date: 12/31/2013  . Smokeless tobacco: Never Used     Comment: occasional smoker of cigarettes and e-cig since 2012  . Alcohol Use: No     Comment: occasional   OB History    No data available     Review of Systems  Genitourinary: Positive for flank pain.  All other systems reviewed and are negative.     Allergies  Nickel  Home Medications   Prior to Admission medications   Medication Sig Start Date End Date Taking? Authorizing Provider  ALPRAZolam Prudy Feeler) 1 MG tablet Take 1 mg by mouth 3 (three) times daily as needed for anxiety.    Historical Provider, MD  amphetamine-dextroamphetamine (ADDERALL) 10 MG tablet Take 10 mg by mouth daily with breakfast.    Historical Provider, MD  benzonatate (TESSALON) 100 MG capsule Take 1 capsule (100 mg total) by mouth 3 (three) times daily as needed for cough (alternate with Tussionex. Do not use together). 10/16/15   Doreene Eland, MD  buprenorphine (SUBUTEX) 8 MG SUBL SL tablet Place 8-12 mg under the tongue daily.  10/23/14   Historical Provider, MD  chlorpheniramine-HYDROcodone (TUSSIONEX PENNKINETIC ER) 10-8 MG/5ML SUER Take 5 mLs by mouth every 12 (twelve) hours as needed for cough (cough and congestion). 10/16/15   Doreene Eland, MD  ciprofloxacin (CIPRO) 500 MG tablet Take 1 tablet (500 mg total) by mouth 2 (two) times daily. To complete 10d course Patient not taking: Reported on 09/12/2015 03/15/15   Tonye Pearsonobert P Doolittle, MD  docusate sodium (COLACE) 100 MG capsule Take 1 capsule (100 mg total) by mouth daily as needed for mild constipation or moderate constipation. Patient not taking: Reported on 09/12/2015 03/08/15   Trixie DredgeEmily West, PA-C  naproxen (NAPROSYN) 500 MG tablet Take 1 tablet (500 mg total) by mouth 2 (two) times daily as needed for mild pain, moderate pain or headache (TAKE  WITH MEALS.). 09/12/15   Mercedes Camprubi-Soms, PA-C  ondansetron (ZOFRAN ODT) 4 MG disintegrating tablet Take 1 tablet (4 mg total) by mouth every 8 (eight) hours as needed for nausea or vomiting. 09/12/15   Mercedes Camprubi-Soms, PA-C  ondansetron (ZOFRAN) 4 MG tablet Take 1 tablet (4 mg total) by mouth every 8 (eight) hours as needed for nausea or vomiting. Patient not taking: Reported on 09/12/2015 03/12/15   Emilia BeckKaitlyn Szekalski, PA-C  oxyCODONE-acetaminophen (PERCOCET) 5-325 MG tablet Take 1 tablet by mouth every 6 (six) hours as needed for severe pain. 09/12/15   Mercedes Camprubi-Soms, PA-C  oxyCODONE-acetaminophen (PERCOCET/ROXICET) 5-325 MG per tablet Take 1-2 tablets by mouth every 6 (six) hours as needed for severe pain. Patient not taking: Reported on 09/12/2015 03/15/15   Tonye Pearsonobert P Doolittle, MD  promethazine (PHENERGAN) 25 MG tablet Take 1 tablet (25 mg total) by mouth every 6 (six) hours as needed for nausea or vomiting. Patient not taking: Reported on 09/12/2015 03/15/15   Tonye Pearsonobert P Doolittle, MD  tamsulosin (FLOMAX) 0.4 MG CAPS capsule Take 1 capsule (0.4 mg total) by mouth daily. Patient not taking: Reported on 09/12/2015 03/08/15   Trixie DredgeEmily West, PA-C  tamsulosin (FLOMAX) 0.4 MG CAPS capsule Take 1 capsule (0.4 mg total) by mouth daily after supper. 09/12/15   Mercedes Camprubi-Soms, PA-C   BP 132/92 mmHg  Pulse 94  Temp(Src) 98.3 F (36.8 C) (Oral)  Resp 18  Ht 5\' 11"  (1.803 m)  Wt 90.719 kg  BMI 27.91 kg/m2  SpO2 100%  LMP 12/01/2015   Physical Exam  Constitutional: She is oriented to person, place, and time. She appears well-developed and well-nourished. No distress.  HENT:  Head: Normocephalic and atraumatic.  Mouth/Throat: Oropharynx is clear and moist.  Eyes: Conjunctivae and EOM are normal. Pupils are equal, round, and reactive to light.  Neck: Normal range of motion. Neck supple.  Cardiovascular: Normal rate, regular rhythm and normal heart sounds.   Pulmonary/Chest: Effort  normal and breath sounds normal. No respiratory distress. She has no wheezes.  Abdominal: Soft. Bowel sounds are normal. There is no tenderness. There is no guarding and no CVA tenderness.  No significant left CVA tenderness  Genitourinary: There is no lesion on the right labia. There is no lesion on the left labia. Cervix exhibits no motion tenderness. Right adnexum displays no tenderness. Left adnexum displays no tenderness. Vaginal discharge found.  Clitoral piercing present, normal external genitalia without visible lesions; minimal amount of clear vaginal discharge noted; no odor; no bleeding; no adnexal or CMT  Musculoskeletal: Normal range of motion. She exhibits no edema.  Neurological: She is alert and oriented to person, place, and time.  Skin: Skin is warm and dry. She is not diaphoretic.  Psychiatric: She has a normal mood and affect.  Nursing note and vitals reviewed.   ED Course  Procedures (including critical care time) Labs Review Labs Reviewed  CBC WITH DIFFERENTIAL/PLATELET -  Abnormal; Notable for the following:    WBC 11.3 (*)    Hemoglobin 11.4 (*)    Neutro Abs 7.9 (*)    All other components within normal limits  URINALYSIS, ROUTINE W REFLEX MICROSCOPIC (NOT AT [facility]) - Abnormal; Notable for the following:    APPearance CLOUDY (*)    Hgb urine dipstick SMALL (*)    All other components within normal limits  HEPATIC FUNCTION PANEL - Abnormal; Notable for the following:    Bilirubin, Direct <0.1 (*)    All other components within normal limits  URINE MICROSCOPIC-ADD ON - Abnormal; Notable for the following:    Squamous Epithelial / LPF 6-30 (*)    Bacteria, UA MANY (*)    All other components within normal limits  BASIC METABOLIC PANEL  LIPASE, BLOOD  POC URINE PREG, ED    Imaging Review No results found. I have personally reviewed and evaluated these images and lab results as part of my medical decision-making.   EKG Interpretation   Date/Time:   Wednesday December 15 2015 04:10:04 EDT Ventricular Rate:  88 PR Interval:  148 QRS Duration: 84 QT Interval:  350 QTC Calculation: 423 R Axis:   37 Text Interpretation:  Normal sinus rhythm Cannot rule out Anterior infarct  , age undetermined Abnormal ECG No old tracing to compare Confirmed by  WARD,  DO, KRISTEN ([id number]) on 12/15/2015 4:24:11 AM      MDM   Final diagnoses:  UTI (lower urinary tract infection)  Flank pain  Vaginal discharge   25 year old female here with multiple complaints.  1-- dysuria. Her UA appears infectious with many bacteria.  She has associated left flank pain but there are no acute ureteral stones noted on CT. She does have bilateral kidney stones. Will start on keflex for UTI/early pyelonephritis.  Diflucan given for yeast infection prevention at patient's request.  Will follow-up with urology for ongoing pain.    2-- vaginal discharge and itching. No significant discharge noted on exam. No adnexal or CMT.  Wet prep is negative. Gc/chl and RPR pending.  Rapid HIV non-reactive.  Treated empirically with rocephin and azithromycin in ED.   Follow-up with GYN for ongoing issues.  3-- constipation.  Likely from recent opioids from her plastic surgery on her nose. Her abdomen is soft and benign with normal bowel sounds. She has no distention. No nausea or vomiting. Clinically do not suspect bowel obstruction and no obstruction noted on her noncontrast CT. Recommended stool softener to help with this.  Discussed plan with patient, he/she acknowledged understanding and agreed with plan of care.  Return precautions given for new or worsening symptoms.  Garlon Hatchet, PA-C 12/15/15 1142  Kristen N Ward, DO 12/15/15 2309

## 2015-12-15 NOTE — ED Notes (Signed)
Lab at bedside to draw

## 2015-12-16 LAB — GC/CHLAMYDIA PROBE AMP (~~LOC~~) NOT AT ARMC
Chlamydia: NEGATIVE
Neisseria Gonorrhea: NEGATIVE

## 2015-12-17 ENCOUNTER — Encounter (HOSPITAL_BASED_OUTPATIENT_CLINIC_OR_DEPARTMENT_OTHER): Payer: Self-pay

## 2015-12-17 ENCOUNTER — Telehealth (HOSPITAL_BASED_OUTPATIENT_CLINIC_OR_DEPARTMENT_OTHER): Payer: Self-pay

## 2015-12-30 ENCOUNTER — Emergency Department (HOSPITAL_COMMUNITY)
Admission: EM | Admit: 2015-12-30 | Discharge: 2015-12-31 | Disposition: A | Discharge status: Home / Self Care | Payer: Managed Care, Other (non HMO) | Attending: Emergency Medicine | Admitting: Emergency Medicine

## 2015-12-30 ENCOUNTER — Encounter (HOSPITAL_COMMUNITY): Payer: Self-pay | Admitting: Emergency Medicine

## 2015-12-30 DIAGNOSIS — Z79899 Other long term (current) drug therapy: Secondary | ICD-10-CM | POA: Insufficient documentation

## 2015-12-30 DIAGNOSIS — R52 Pain, unspecified: Secondary | ICD-10-CM

## 2015-12-30 DIAGNOSIS — R109 Unspecified abdominal pain: Secondary | ICD-10-CM

## 2015-12-30 DIAGNOSIS — R110 Nausea: Secondary | ICD-10-CM | POA: Diagnosis not present

## 2015-12-30 DIAGNOSIS — Z87891 Personal history of nicotine dependence: Secondary | ICD-10-CM | POA: Insufficient documentation

## 2015-12-30 DIAGNOSIS — Z79891 Long term (current) use of opiate analgesic: Secondary | ICD-10-CM | POA: Diagnosis not present

## 2015-12-30 LAB — URINALYSIS, ROUTINE W REFLEX MICROSCOPIC
Bilirubin Urine: NEGATIVE
Glucose, UA: NEGATIVE mg/dL
Ketones, ur: NEGATIVE mg/dL
Nitrite: NEGATIVE
Protein, ur: NEGATIVE mg/dL
Specific Gravity, Urine: 1.022 (ref 1.005–1.030)
pH: 5.5 (ref 5.0–8.0)

## 2015-12-30 LAB — BASIC METABOLIC PANEL
Anion gap: 6 (ref 5–15)
BUN: 11 mg/dL (ref 6–20)
CO2: 27 mmol/L (ref 22–32)
Calcium: 9.5 mg/dL (ref 8.9–10.3)
Chloride: 106 mmol/L (ref 101–111)
Creatinine, Ser: 0.7 mg/dL (ref 0.44–1.00)
GFR calc Af Amer: >60 mL/min (ref >60)
GFR calc non Af Amer: >60 mL/min (ref >60)
Glucose, Bld: 94 mg/dL (ref 65–99)
Potassium: 4.2 mmol/L (ref 3.5–5.1)
Sodium: 139 mmol/L (ref 135–145)

## 2015-12-30 LAB — CBC
HCT: 36.8 % (ref 36.0–46.0)
Hemoglobin: 11.8 g/dL — ABNORMAL LOW (ref 12.0–15.0)
MCH: 26.4 pg (ref 26.0–34.0)
MCHC: 32.1 g/dL (ref 30.0–36.0)
MCV: 82.3 fL (ref 78.0–100.0)
Platelets: 316 10*3/uL (ref 150–400)
RBC: 4.47 MIL/uL (ref 3.87–5.11)
RDW: 13.6 % (ref 11.5–15.5)
WBC: 9.9 10*3/uL (ref 4.0–10.5)

## 2015-12-30 LAB — URINE MICROSCOPIC-ADD ON

## 2015-12-30 LAB — POC URINE PREG, ED: Preg Test, Ur: NEGATIVE

## 2015-12-30 MED ORDER — OXYCODONE-ACETAMINOPHEN 5-325 MG PO TABS
1.0000 | ORAL_TABLET | ORAL | Status: AC | PRN
  Administered 2015-12-30 – 2015-12-31 (×2): 1 via ORAL
  Filled 2015-12-30 (×2): qty 1

## 2015-12-30 NOTE — ED Notes (Addendum)
Pt c/o L flank pain that radiates around to her lower abdomen with N/V since earlier today. Pt A&Ox4 and ambulatory. Pt has hx of UTI and similar pain, [facility]. Pt c/o urinary frequency and dysuria.

## 2015-12-31 ENCOUNTER — Emergency Department (HOSPITAL_COMMUNITY): Payer: Managed Care, Other (non HMO)

## 2015-12-31 ENCOUNTER — Encounter (HOSPITAL_COMMUNITY): Payer: Self-pay | Admitting: Emergency Medicine

## 2015-12-31 MED ORDER — ONDANSETRON HCL 4 MG/2ML IJ SOLN
4.0000 mg | Freq: Once | INTRAMUSCULAR | Status: AC
  Administered 2015-12-31: 4 mg via INTRAVENOUS
  Filled 2015-12-31: qty 2

## 2015-12-31 MED ORDER — FENTANYL CITRATE (PF) 100 MCG/2ML IJ SOLN
100.0000 ug | Freq: Once | INTRAMUSCULAR | Status: AC
  Administered 2015-12-31: 100 ug via INTRAVENOUS
  Filled 2015-12-31: qty 2

## 2015-12-31 MED ORDER — ONDANSETRON 8 MG PO TBDP: ORAL_TABLET | ORAL | Status: DC

## 2015-12-31 MED ORDER — SODIUM CHLORIDE 0.9 % IV BOLUS (SEPSIS)
1000.0000 mL | Freq: Once | INTRAVENOUS | Status: AC
  Administered 2015-12-31: 1000 mL via INTRAVENOUS

## 2015-12-31 MED ORDER — KETOROLAC TROMETHAMINE 30 MG/ML IJ SOLN
30.0000 mg | Freq: Once | INTRAMUSCULAR | Status: AC
  Administered 2015-12-31: 30 mg via INTRAVENOUS
  Filled 2015-12-31: qty 1

## 2015-12-31 MED ORDER — MELOXICAM 7.5 MG PO TABS: 7.5000 mg | ORAL_TABLET | Freq: Every day | ORAL | Status: DC

## 2015-12-31 NOTE — ED Provider Notes (Signed)
CSN: [id number]     Arrival date & time 12/30/15  2117 History  By signing my name below, I, Rosario AdieWilliam Andrew Hiatt, attest that this documentation has been prepared under the direction and in the presence of Calyssa Zobrist, MD.  Electronically Signed: Rosario AdieWilliam Andrew Hiatt, ED Scribe. 12/31/2015. 12:34 AM.   Chief Complaint  Patient presents with  . Flank Pain   Patient is a 25 y.o. female presenting with flank pain. The history is provided by the patient and medical records. No language interpreter was used.  Flank Pain This is a recurrent problem. The current episode started 3 to 5 hours ago. The problem occurs constantly. The problem has not changed since onset.Pertinent negatives include no chest pain, no abdominal pain, no headaches and no shortness of breath. Nothing aggravates the symptoms. Nothing relieves the symptoms. She has tried nothing for the symptoms. The treatment provided no relief.  HPI Comments: Emily Silva is a 25 y.o. female with a PMHx significant for pyelonephritis who presents to the Emergency Department complaining of gradual onset, gradually worsening, intermittent, 9/10 left flank pain onset 7 hours PTA. Her pain is described as sharp and she states that it radiates into her left sided, lower abdomen. She has associated nausea secondary to her pain, urinary frequency, and dysuria. She has also had recent cloudy urine. Pt reports that she has a history of UTIs and her pain today feels very similar to her pain in the past. She was seen in the ED approximately 2 weeks PTA with similar symptoms, and was dx'd with a UTI and early Pyelonephritis. She was prescribed a 10-day course of Keflex, which she has finished. She has not taken any OTC pain medications PTA. She has a PSHx of Nephrolithotomy approximately 3 years PTA. She denies diarrhea, constipation, and vomiting. LNMP: 12/17/15  Past Medical History  Diagnosis Date  . ADHD (attention deficit hyperactivity disorder)   .  Anxiety disorder   . Right ureteral stone   . Hematuria   . Frequency of urination   . History of kidney stones   . Renal disorder     Kidney stones   Past Surgical History  Procedure Laterality Date  . Wisdom tooth extraction    . Cystoscopy with ureteroscopy and stent placement Right 03/17/2013    Procedure: CYSTOSCOPY WITH RIGHT URETEROSCOPY HOLMIUM LASER OF STONESER LITHOTRIPSY AND STENT PLACEMENT;  Surgeon: Garnett FarmMark C Ottelin, MD;  [facility] [place];  Service: Urology;  Laterality: Right;  . Rhinoplasty     Family History  Problem Relation Age of Onset  . Cancer Father    Social History  Substance Use Topics  . Smoking status: Former Smoker    Types: Cigarettes    Quit date: 12/31/2013  . Smokeless tobacco: Never Used     Comment: occasional smoker of cigarettes and e-cig since 2012  . Alcohol Use: No     Comment: occasional   OB History    No data available     Review of Systems  Respiratory: Negative for shortness of breath.   Cardiovascular: Negative for chest pain.  Gastrointestinal: Positive for nausea. Negative for vomiting, abdominal pain, diarrhea and constipation.  Genitourinary: Positive for frequency and flank pain (left).  Neurological: Negative for headaches.  All other systems reviewed and are negative.  Allergies  Nickel  Home Medications   Prior to Admission medications   Medication Sig Start Date End Date Taking? Authorizing Provider  ALPRAZolam Prudy Feeler(XANAX) 1 MG tablet Take 1  mg by mouth 3 (three) times daily as needed for anxiety.   Yes Historical Provider, MD  amphetamine-dextroamphetamine (ADDERALL) 10 MG tablet Take 10 mg by mouth daily with breakfast.   Yes Historical Provider, MD  buprenorphine (SUBUTEX) 8 MG SUBL SL tablet Place 8-12 mg under the tongue daily.  10/23/14  Yes Historical Provider, MD  cephALEXin (KEFLEX) 500 MG capsule Take 1 capsule (500 mg total) by mouth 2 (two) times daily. Patient not taking: Reported on  12/30/2015 12/15/15   Garlon HatchetLisa M Sanders, PA-C  fluconazole (DIFLUCAN) 150 MG tablet Take 1 tablet (150 mg total) by mouth once. Repeat in 72 hours if needed. Patient not taking: Reported on 12/30/2015 12/15/15   Garlon HatchetLisa M Sanders, PA-C  HYDROcodone-acetaminophen (NORCO/VICODIN) 5-325 MG tablet Take 1 tablet by mouth every 4 (four) hours as needed. Patient not taking: Reported on 12/30/2015 12/15/15   Garlon HatchetLisa M Sanders, PA-C   BP 142/87 mmHg  Pulse 78  Temp(Src) 98.3 F (36.8 C) (Oral)  Resp 16  Ht 5\' 11"  (1.803 m)  Wt 210 lb (95.255 kg)  BMI 29.30 kg/m2  SpO2 100%  LMP 12/01/2015   Physical Exam  Constitutional: She is oriented to person, place, and time. She appears well-developed and well-nourished. No distress.  HENT:  Head: Normocephalic and atraumatic.  Mouth/Throat: Oropharynx is clear and moist. No oropharyngeal exudate.  Eyes: Conjunctivae and EOM are normal. Pupils are equal, round, and reactive to light.  Neck: Trachea normal and normal range of motion. Neck supple. No JVD present. Carotid bruit is not present. No tracheal deviation present.  Cardiovascular: Normal rate and regular rhythm.  Exam reveals no gallop and no friction rub.   No murmur heard. Pulmonary/Chest: Effort normal and breath sounds normal. No stridor. She has no wheezes. She has no rales.  Abdominal: Soft. Bowel sounds are normal. She exhibits no mass. There is no tenderness. There is no rebound, no guarding and no CVA tenderness.  Musculoskeletal: Normal range of motion.  Lymphadenopathy:    She has no cervical adenopathy.  Neurological: She is alert and oriented to person, place, and time. She has normal reflexes. No cranial nerve deficit. She exhibits normal muscle tone. Coordination normal.  Skin: Skin is warm and dry. She is not diaphoretic.  Psychiatric: She has a normal mood and affect. Her behavior is normal.  Nursing note and vitals reviewed.  ED Course  Procedures (including critical care  time)  DIAGNOSTIC STUDIES: Oxygen Saturation is 100% on RA, normal by my interpretation.   COORDINATION OF CARE: 12:34 AM-Discussed next steps with pt including CT Renal Stone Study. Pt verbalized understanding and is agreeable with the plan.   Labs Review Labs Reviewed  URINALYSIS, ROUTINE W REFLEX MICROSCOPIC (NOT AT [facility]) - Abnormal; Notable for the following:    Hgb urine dipstick LARGE (*)    Leukocytes, UA TRACE (*)    All other components within normal limits  CBC - Abnormal; Notable for the following:    Hemoglobin 11.8 (*)    All other components within normal limits  URINE MICROSCOPIC-ADD ON - Abnormal; Notable for the following:    Squamous Epithelial / LPF 0-5 (*)    Bacteria, UA FEW (*)    All other components within normal limits  BASIC METABOLIC PANEL  POC URINE PREG, ED    Imaging Review No results found.  I have personally reviewed and evaluated these images and lab results as part of my medical decision-making.  MDM   Final diagnoses:  None   Filed Vitals:   12/31/15 0230 12/31/15 0300  BP: 119/82 119/78  Pulse: 70 74  Temp:    Resp: 17 14   Results for orders placed or performed during the hospital encounter of 12/30/15  Urinalysis, Routine w reflex microscopic- may I&O cath if menses  Result Value Ref Range   Color, Urine YELLOW YELLOW   APPearance CLEAR CLEAR   Specific Gravity, Urine 1.022 1.005 - 1.030   pH 5.5 5.0 - 8.0   Glucose, UA NEGATIVE NEGATIVE mg/dL   Hgb urine dipstick LARGE (A) NEGATIVE   Bilirubin Urine NEGATIVE NEGATIVE   Ketones, ur NEGATIVE NEGATIVE mg/dL   Protein, ur NEGATIVE NEGATIVE mg/dL   Nitrite NEGATIVE NEGATIVE   Leukocytes, UA TRACE (A) NEGATIVE  CBC  Result Value Ref Range   WBC 9.9 4.0 - 10.5 K/uL   RBC 4.47 3.87 - 5.11 MIL/uL   Hemoglobin 11.8 (L) 12.0 - 15.0 g/dL   HCT 78.2 95.6 - 21.3 %   MCV 82.3 78.0 - 100.0 fL   MCH 26.4 26.0 - 34.0 pg   MCHC 32.1 30.0 - 36.0 g/dL   RDW 08.6 57.8 - 46.9 %    Platelets 316 150 - 400 K/uL  Basic metabolic panel  Result Value Ref Range   Sodium 139 135 - 145 mmol/L   Potassium 4.2 3.5 - 5.1 mmol/L   Chloride 106 101 - 111 mmol/L   CO2 27 22 - 32 mmol/L   Glucose, Bld 94 65 - 99 mg/dL   BUN 11 6 - 20 mg/dL   Creatinine, Ser 6.29 0.44 - 1.00 mg/dL   Calcium 9.5 8.9 - 52.8 mg/dL   GFR calc non Af Amer >60 >60 mL/min   GFR calc Af Amer >60 >60 mL/min   Anion gap 6 5 - 15  Urine microscopic-add on  Result Value Ref Range   Squamous Epithelial / LPF 0-5 (A) NONE SEEN   WBC, UA 0-5 0 - 5 WBC/hpf   RBC / HPF TOO NUMEROUS TO COUNT 0 - 5 RBC/hpf   Bacteria, UA FEW (A) NONE SEEN   Urine-Other MUCOUS PRESENT   POC urine preg, ED  Result Value Ref Range   Preg Test, Ur NEGATIVE NEGATIVE   Ct Renal Stone Study  12/31/2015  CLINICAL DATA:  Left flank pain for several hours EXAM: CT ABDOMEN AND PELVIS WITHOUT CONTRAST TECHNIQUE: Multidetector CT imaging of the abdomen and pelvis was performed following the standard protocol without IV contrast. COMPARISON:  12/15/2015 FINDINGS: Multiple collecting system calculi are present bilaterally, measuring 2-4 mm. There are no ureteral calculi. There is no hydronephrosis. No perinephric abscess or other collection. There are unremarkable unenhanced appearances of the liver, gallbladder, bile ducts, pancreas, spleen and adrenals. The stomach, small bowel and colon are unremarkable. Uterus and ovaries are unremarkable. The abdominal aorta is normal in caliber. There is no atherosclerotic calcification. There is no adenopathy in the abdomen or pelvis. There is no significant abnormality in the lower chest. There is no significant skeletal abnormality. IMPRESSION: Bilateral nephrolithiasis. No perinephric collection or abscess. No hydronephrosis. Electronically Signed   By: Ellery Plunk M.D.   On: 12/31/2015 02:33   Ct Renal Stone Study  12/15/2015  CLINICAL DATA:  Acute left flank pain. EXAM: CT ABDOMEN AND PELVIS  WITHOUT CONTRAST TECHNIQUE: Multidetector CT imaging of the abdomen and pelvis was performed following the standard protocol without IV contrast. COMPARISON:  CT scan of Trai Ells 25, 2016. FINDINGS: Visualized lung bases  are unremarkable. No significant osseous abnormality is noted. No gallstones are noted. No focal abnormality is seen in the liver, spleen or pancreas on these unenhanced images. Adrenal glands appear normal. Bilateral nephrolithiasis is noted. No hydronephrosis or renal obstruction is noted. No ureteral calculi are noted. The appendix appears normal. There is no evidence of bowel obstruction. Urinary bladder appears normal. Uterus and ovaries appear normal. No abnormal fluid collection is noted. No significant adenopathy is noted. IMPRESSION: Bilateral nonobstructive nephrolithiasis. No hydronephrosis or renal obstruction is noted. Electronically Signed   By: Lupita Raider, M.D.   On: 12/15/2015 09:06    Medications  ketorolac (TORADOL) 30 MG/ML injection 30 mg (not administered)  sodium chloride 0.9 % bolus 1,000 mL (not administered)  ondansetron (ZOFRAN) injection 4 mg (not administered)  fentaNYL (SUBLIMAZE) injection 100 mcg (not administered)  oxyCODONE-acetaminophen (PERCOCET/ROXICET) 5-325 MG per tablet 1 tablet (1 tablet Oral Given 12/31/15 0108)    No stones in the ureters.  Suspect MSK pain.  Will d/c with zofran and NSAIDs.  Close follow up with your PMD strict return precautions  I personally performed the services described in this documentation, which was scribed in my presence. The recorded information has been reviewed and is accurate.       Cy Blamer, MD 12/31/15 [phone]

## 2016-04-02 ENCOUNTER — Encounter (HOSPITAL_COMMUNITY): Payer: Self-pay | Admitting: Emergency Medicine

## 2016-04-02 ENCOUNTER — Ambulatory Visit (HOSPITAL_COMMUNITY)
Admission: EM | Admit: 2016-04-02 | Discharge: 2016-04-02 | Disposition: A | Discharge status: Home / Self Care | Payer: Managed Care, Other (non HMO) | Attending: Family Medicine | Admitting: Family Medicine

## 2016-04-02 DIAGNOSIS — Z79899 Other long term (current) drug therapy: Secondary | ICD-10-CM | POA: Diagnosis not present

## 2016-04-02 DIAGNOSIS — R05 Cough: Secondary | ICD-10-CM | POA: Insufficient documentation

## 2016-04-02 DIAGNOSIS — J029 Acute pharyngitis, unspecified: Secondary | ICD-10-CM | POA: Diagnosis not present

## 2016-04-02 DIAGNOSIS — F909 Attention-deficit hyperactivity disorder, unspecified type: Secondary | ICD-10-CM | POA: Diagnosis not present

## 2016-04-02 DIAGNOSIS — Z87891 Personal history of nicotine dependence: Secondary | ICD-10-CM | POA: Insufficient documentation

## 2016-04-02 DIAGNOSIS — R350 Frequency of micturition: Secondary | ICD-10-CM | POA: Insufficient documentation

## 2016-04-02 DIAGNOSIS — J069 Acute upper respiratory infection, unspecified: Secondary | ICD-10-CM | POA: Diagnosis not present

## 2016-04-02 DIAGNOSIS — B9789 Other viral agents as the cause of diseases classified elsewhere: Secondary | ICD-10-CM

## 2016-04-02 DIAGNOSIS — Z9889 Other specified postprocedural states: Secondary | ICD-10-CM | POA: Insufficient documentation

## 2016-04-02 DIAGNOSIS — F419 Anxiety disorder, unspecified: Secondary | ICD-10-CM | POA: Insufficient documentation

## 2016-04-02 DIAGNOSIS — Z87442 Personal history of urinary calculi: Secondary | ICD-10-CM | POA: Diagnosis not present

## 2016-04-02 LAB — POCT RAPID STREP A: Streptococcus, Group A Screen (Direct): NEGATIVE

## 2016-04-02 MED ORDER — BENZONATATE 100 MG PO CAPS: 100.0000 mg | ORAL_CAPSULE | Freq: Three times a day (TID) | ORAL | 0 refills | Status: DC

## 2016-04-02 MED ORDER — HYDROCOD POLST-CPM POLST ER 10-8 MG/5ML PO SUER: 5.0000 mL | Freq: Every evening | ORAL | 0 refills | Status: DC | PRN

## 2016-04-02 MED ORDER — AMOXICILLIN 250 MG PO CAPS: 250.0000 mg | ORAL_CAPSULE | Freq: Three times a day (TID) | ORAL | 0 refills | Status: DC

## 2016-04-02 NOTE — ED Provider Notes (Signed)
CSN: [id number]     Arrival date & time 04/02/16  1609 History   First MD Initiated Contact with Patient 04/02/16 1710     No chief complaint on file.  (Consider location/radiation/quality/duration/timing/severity/associated sxs/prior Treatment) HPI 25 Y/O FEMALE WITH COUGH, SORE THROAT, CONGESTION FOR 2-3 DAYS. NON PRODUCTIVE COUGH, MOSTLY DRY, BUT WORSE AT NIGHT. NO RELIEF WITH OTC MEDS AT HOME. PREVIOUS SXS OF THIS NATURE.  Past Medical History:  Diagnosis Date  . ADHD (attention deficit hyperactivity disorder)   . Anxiety disorder   . Frequency of urination   . Hematuria   . History of kidney stones   . Renal disorder    Kidney stones  . Right ureteral stone    Past Surgical History:  Procedure Laterality Date  . CYSTOSCOPY WITH URETEROSCOPY AND STENT PLACEMENT Right 03/17/2013   Procedure: CYSTOSCOPY WITH RIGHT URETEROSCOPY HOLMIUM LASER OF STONESER LITHOTRIPSY AND STENT PLACEMENT;  Surgeon: Garnett FarmMark C Ottelin, MD;  Location: [facility];  Service: Urology;  Laterality: Right;  . RHINOPLASTY    . WISDOM TOOTH EXTRACTION     Family History  Problem Relation Age of Onset  . Cancer Father    Social History  Substance Use Topics  . Smoking status: Former Smoker    Types: Cigarettes    Quit date: 12/31/2013  . Smokeless tobacco: Never Used     Comment: occasional smoker of cigarettes and e-cig since 2012  . Alcohol use No     Comment: occasional   OB History    No data available     Review of Systems  Denies: HEADACHE, NAUSEA, ABDOMINAL PAIN, CHEST PAIN, CONGESTION, DYSURIA, SHORTNESS OF BREATH  Allergies  Nickel  Home Medications   Prior to Admission medications   Medication Sig Start Date End Date Taking? Authorizing Provider  ALPRAZolam Prudy Feeler(XANAX) 1 MG tablet Take 1 mg by mouth 3 (three) times daily as needed for anxiety.    Historical Provider, MD  amphetamine-dextroamphetamine (ADDERALL) 10 MG tablet Take 10 mg by mouth daily with breakfast.     Historical Provider, MD  buprenorphine (SUBUTEX) 8 MG SUBL SL tablet Place 8-12 mg under the tongue daily.  10/23/14   Historical Provider, MD  cephALEXin (KEFLEX) 500 MG capsule Take 1 capsule (500 mg total) by mouth 2 (two) times daily. Patient not taking: Reported on 12/30/2015 12/15/15   Garlon HatchetLisa M Sanders, PA-C  fluconazole (DIFLUCAN) 150 MG tablet Take 1 tablet (150 mg total) by mouth once. Repeat in 72 hours if needed. Patient not taking: Reported on 12/30/2015 12/15/15   Garlon HatchetLisa M Sanders, PA-C  HYDROcodone-acetaminophen (NORCO/VICODIN) 5-325 MG tablet Take 1 tablet by mouth every 4 (four) hours as needed. Patient not taking: Reported on 12/30/2015 12/15/15   Garlon HatchetLisa M Sanders, PA-C  meloxicam (MOBIC) 7.5 MG tablet Take 1 tablet (7.5 mg total) by mouth daily. 12/31/15   April Palumbo, MD  ondansetron [facility](ZOFRAN ODT) 8 MG disintegrating tablet 8mg  ODT q8 hours prn nausea 12/31/15   April Palumbo, MD   Meds Ordered and Administered this Visit  Medications - No data to display  BP 135/86 (BP Location: Left Arm)   Pulse 88   Temp 98.7 F (37.1 C) (Oral)   Resp 18   Ht 5\' 11"  (1.803 m)   Wt 215 lb (97.5 kg)   SpO2 100%   BMI 29.99 kg/m  No data found.   Physical Exam NURSES NOTES AND VITAL SIGNS REVIEWED. CONSTITUTIONAL: Well developed, well nourished, no acute distress HEENT: normocephalic,  atraumatic, THROAT IS CLEAR WITH EXUDATE OR TONSILLAR HYPERTROPHY.  EYES: Conjunctiva normal NECK:normal ROM, supple, no adenopathy PULMONARY:No respiratory distress, normal effort ABDOMINAL: Soft, ND, NT BS+, No CVAT MUSCULOSKELETAL: Normal ROM of all extremities,  SKIN: warm and dry without rash PSYCHIATRIC: Mood and affect, behavior are normal  Urgent Care Course   Clinical Course    Procedures (including critical care time)  Labs Review Labs Reviewed  POCT RAPID STREP A    Imaging Review No results found.   Visual Acuity Review  Right Eye Distance:   Left Eye Distance:   Bilateral  Distance:    Right Eye Near:   Left Eye Near:    Bilateral Near:         MDM   1. Viral URI with cough   2. Viral pharyngitis     Patient is reassured that there are no issues that require transfer to higher level of care at this time or additional tests. Patient is advised to continue home symptomatic treatment. Patient is advised that if there are new or worsening symptoms to attend the emergency department, contact primary care provider, or return to UC. Instructions of care provided discharged home in stable condition.    THIS NOTE WAS GENERATED USING A VOICE RECOGNITION SOFTWARE PROGRAM. ALL REASONABLE EFFORTS  WERE MADE TO PROOFREAD THIS DOCUMENT FOR ACCURACY.  I have verbally reviewed the discharge instructions with the patient. A printed AVS was given to the patient.  All questions were answered prior to discharge.      Tharon Aquas, PA 04/02/16 1730

## 2016-04-02 NOTE — ED Triage Notes (Signed)
[facility] with a complaint of a sore throat, fever, and non-productive cough x 2 days. The patient reported a fever at home last night.

## 2016-04-04 LAB — CULTURE, GROUP A STREP (THRC)

## 2016-05-12 ENCOUNTER — Encounter (HOSPITAL_COMMUNITY): Payer: Self-pay

## 2016-05-12 ENCOUNTER — Emergency Department (HOSPITAL_COMMUNITY)
Admission: EM | Admit: 2016-05-12 | Discharge: 2016-05-12 | Disposition: A | Discharge status: Home / Self Care | Payer: Managed Care, Other (non HMO) | Attending: Emergency Medicine | Admitting: Emergency Medicine

## 2016-05-12 ENCOUNTER — Emergency Department (HOSPITAL_COMMUNITY): Payer: Managed Care, Other (non HMO)

## 2016-05-12 DIAGNOSIS — N23 Unspecified renal colic: Secondary | ICD-10-CM | POA: Insufficient documentation

## 2016-05-12 DIAGNOSIS — R109 Unspecified abdominal pain: Secondary | ICD-10-CM | POA: Diagnosis present

## 2016-05-12 DIAGNOSIS — F17210 Nicotine dependence, cigarettes, uncomplicated: Secondary | ICD-10-CM | POA: Diagnosis not present

## 2016-05-12 DIAGNOSIS — F909 Attention-deficit hyperactivity disorder, unspecified type: Secondary | ICD-10-CM | POA: Insufficient documentation

## 2016-05-12 LAB — CBC
HCT: 38.1 % (ref 36.0–46.0)
Hemoglobin: 12.5 g/dL (ref 12.0–15.0)
MCH: 27.1 pg (ref 26.0–34.0)
MCHC: 32.8 g/dL (ref 30.0–36.0)
MCV: 82.5 fL (ref 78.0–100.0)
Platelets: 287 10*3/uL (ref 150–400)
RBC: 4.62 MIL/uL (ref 3.87–5.11)
RDW: 14 % (ref 11.5–15.5)
WBC: 10.7 10*3/uL — ABNORMAL HIGH (ref 4.0–10.5)

## 2016-05-12 LAB — BASIC METABOLIC PANEL
Anion gap: 10 (ref 5–15)
BUN: 12 mg/dL (ref 6–20)
CO2: 21 mmol/L — ABNORMAL LOW (ref 22–32)
Calcium: 9.3 mg/dL (ref 8.9–10.3)
Chloride: 106 mmol/L (ref 101–111)
Creatinine, Ser: 0.74 mg/dL (ref 0.44–1.00)
GFR calc Af Amer: >60 mL/min (ref >60)
GFR calc non Af Amer: >60 mL/min (ref >60)
Glucose, Bld: 106 mg/dL — ABNORMAL HIGH (ref 65–99)
Potassium: 3.9 mmol/L (ref 3.5–5.1)
Sodium: 137 mmol/L (ref 135–145)

## 2016-05-12 LAB — URINALYSIS, ROUTINE W REFLEX MICROSCOPIC
Bilirubin Urine: NEGATIVE
Glucose, UA: NEGATIVE mg/dL
Ketones, ur: NEGATIVE mg/dL
Nitrite: NEGATIVE
Protein, ur: NEGATIVE mg/dL
Specific Gravity, Urine: 1.022 (ref 1.005–1.030)
pH: 5.5 (ref 5.0–8.0)

## 2016-05-12 LAB — URINE MICROSCOPIC-ADD ON

## 2016-05-12 LAB — POC URINE PREG, ED: Preg Test, Ur: NEGATIVE

## 2016-05-12 MED ORDER — ONDANSETRON HCL 4 MG/2ML IJ SOLN
4.0000 mg | Freq: Once | INTRAMUSCULAR | Status: AC
  Administered 2016-05-12: 4 mg via INTRAVENOUS
  Filled 2016-05-12: qty 2

## 2016-05-12 MED ORDER — MORPHINE SULFATE (PF) 4 MG/ML IV SOLN
4.0000 mg | Freq: Once | INTRAVENOUS | Status: AC
  Administered 2016-05-12: 4 mg via INTRAVENOUS
  Filled 2016-05-12: qty 1

## 2016-05-12 MED ORDER — KETOROLAC TROMETHAMINE 30 MG/ML IJ SOLN
30.0000 mg | Freq: Once | INTRAMUSCULAR | Status: AC
  Administered 2016-05-12: 30 mg via INTRAVENOUS
  Filled 2016-05-12: qty 1

## 2016-05-12 MED ORDER — SODIUM CHLORIDE 0.9 % IV BOLUS (SEPSIS)
1000.0000 mL | Freq: Once | INTRAVENOUS | Status: AC
  Administered 2016-05-12: 1000 mL via INTRAVENOUS

## 2016-05-12 MED ORDER — HYDROCODONE-ACETAMINOPHEN 5-325 MG PO TABS
1.0000 | ORAL_TABLET | Freq: Four times a day (QID) | ORAL | 0 refills | Status: DC | PRN
Start: 2016-05-12 — End: 2016-05-12

## 2016-05-12 MED ORDER — NAPROXEN 500 MG PO TABS: 500.0000 mg | ORAL_TABLET | Freq: Two times a day (BID) | ORAL | 0 refills | Status: DC | PRN

## 2016-05-12 MED ORDER — HYDROCODONE-ACETAMINOPHEN 5-325 MG PO TABS: 1.0000 | ORAL_TABLET | Freq: Four times a day (QID) | ORAL | 0 refills | Status: DC | PRN

## 2016-05-12 MED ORDER — ONDANSETRON 4 MG PO TBDP: 4.0000 mg | ORAL_TABLET | Freq: Three times a day (TID) | ORAL | 0 refills | Status: DC | PRN

## 2016-05-12 NOTE — ED Provider Notes (Signed)
[facility]-EMERGENCY DEPT Provider Note   CSN: [id number] Arrival date & time: 05/12/16  0043     History   Chief Complaint Chief Complaint  Patient presents with  . Flank Pain    HPI Emily Silva is a 25 y.o. female.  The history is provided by the patient and medical records. No language interpreter was used.  Flank Pain  Pertinent negatives include no abdominal pain, no headaches and no shortness of breath.   Emily Silva is a 25 y.o. female  with a PMH of kidney stones presents to the Emergency Department complaining of acute onset of waxing-waning squeezing right flank pain which radiates to back which began last night 8/10. Patient states pain today feels similar to prior kidney stones. She is followed by urology and has had multiple kidney stents in the past. She endorses associated nausea, but denies emesis. No dysuria or abdominal pain. No fevers.  Past Medical History:  Diagnosis Date  . ADHD (attention deficit hyperactivity disorder)   . Anxiety disorder   . Frequency of urination   . Hematuria   . History of kidney stones   . Renal disorder    Kidney stones  . Right ureteral stone     Patient Active Problem List   Diagnosis Date Noted  . ADHD (attention deficit hyperactivity disorder) 03/15/2015  . Anxiety disorder 03/15/2015  . Ureteral calculus, right 03/16/2013    Past Surgical History:  Procedure Laterality Date  . CYSTOSCOPY WITH URETEROSCOPY AND STENT PLACEMENT Right 03/17/2013   Procedure: CYSTOSCOPY WITH RIGHT URETEROSCOPY HOLMIUM LASER OF STONESER LITHOTRIPSY AND STENT PLACEMENT;  Surgeon: Garnett Farm, MD;  Location: [facility];  Service: Urology;  Laterality: Right;  . RHINOPLASTY    . WISDOM TOOTH EXTRACTION      OB History    No data available       Home Medications    Prior to Admission medications   Medication Sig Start Date End Date Taking? Authorizing Provider  ALPRAZolam Prudy Feeler) 1 MG tablet Take 1 mg by  mouth 3 (three) times daily as needed for anxiety.    Historical Provider, MD  amoxicillin (AMOXIL) 250 MG capsule Take 1 capsule (250 mg total) by mouth 3 (three) times daily. 04/02/16   Tharon Aquas, PA  amphetamine-dextroamphetamine (ADDERALL) 10 MG tablet Take 10 mg by mouth daily with breakfast.    Historical Provider, MD  benzonatate (TESSALON) 100 MG capsule Take 1 capsule (100 mg total) by mouth every 8 (eight) hours. 04/02/16   Tharon Aquas, PA  buprenorphine (SUBUTEX) 8 MG SUBL SL tablet Place 8-12 mg under the tongue daily.  10/23/14   Historical Provider, MD  cephALEXin (KEFLEX) 500 MG capsule Take 1 capsule (500 mg total) by mouth 2 (two) times daily. Patient not taking: Reported on 12/30/2015 12/15/15   Garlon Hatchet, PA-C  chlorpheniramine-HYDROcodone [facility]) 10-8 MG/5ML SUER Take 5 mLs by mouth at bedtime as needed for cough. 04/02/16   Tharon Aquas, PA  fluconazole (DIFLUCAN) 150 MG tablet Take 1 tablet (150 mg total) by mouth once. Repeat in 72 hours if needed. Patient not taking: Reported on 12/30/2015 12/15/15   Garlon Hatchet, PA-C  HYDROcodone-acetaminophen [facility]) 5-325 MG tablet Take 1 tablet by mouth every 6 (six) hours as needed for moderate pain. 05/12/16   Chase Picket Ward, PA-C  naproxen (NAPROSYN) 500 MG tablet Take 1 tablet (500 mg total) by mouth 2 (two) times daily as needed for  moderate pain. 05/12/16   Jaime Pilcher Ward, PA-C  ondansetron (ZOFRAN ODT) 4 MG disintegrating tablet Take 1 tablet (4 mg total) by mouth every 8 (eight) hours as needed for nausea or vomiting. 05/12/16   Chase PicketJaime Pilcher Ward, PA-C    Family History Family History  Problem Relation Age of Onset  . Cancer Father     Social History Social History  Substance Use Topics  . Smoking status: Former Smoker    Types: Cigarettes    Quit date: 12/31/2013  . Smokeless tobacco: Never Used     Comment: occasional smoker of cigarettes and e-cig since 2012  . Alcohol use No       Comment: occasional     Allergies   Nickel   Review of Systems Review of Systems  Constitutional: Negative for fever.  HENT: Negative for congestion.   Eyes: Negative for visual disturbance.  Respiratory: Negative for cough and shortness of breath.   Cardiovascular: Negative.   Gastrointestinal: Positive for nausea. Negative for abdominal pain and vomiting.  Genitourinary: Positive for flank pain. Negative for dysuria and urgency.  Musculoskeletal: Positive for back pain. Negative for neck pain.  Skin: Negative for rash.  Neurological: Negative for headaches.     Physical Exam Updated Vital Signs BP 125/90   Pulse 86   Temp 98.6 F (37 C) (Oral)   Resp 18   Ht 5\' 11"  (1.803 m)   Wt 99.8 kg   LMP 04/21/2016   SpO2 100%   BMI 30.68 kg/m   Physical Exam  Constitutional: She is oriented to person, place, and time. She appears well-developed and well-nourished. No distress.  HENT:  Head: Normocephalic and atraumatic.  Cardiovascular: Normal rate, regular rhythm and normal heart sounds.   No murmur heard. Pulmonary/Chest: Effort normal and breath sounds normal. No respiratory distress.  Abdominal: Soft. Bowel sounds are normal. She exhibits no distension. There is no tenderness.  No CVA tenderness. Tenderness palpation of right flank.  Musculoskeletal: Normal range of motion.  Neurological: She is alert and oriented to person, place, and time.  Skin: Skin is warm and dry.  Nursing note and vitals reviewed.    ED Treatments / Results  Labs (all labs ordered are listed, but only abnormal results are displayed) Labs Reviewed  URINALYSIS, ROUTINE W REFLEX MICROSCOPIC (NOT AT [facility]) - Abnormal; Notable for the following:       Result Value   APPearance CLOUDY (*)    Hgb urine dipstick LARGE (*)    Leukocytes, UA SMALL (*)    All other components within normal limits  CBC - Abnormal; Notable for the following:    WBC 10.7 (*)    All other components within  normal limits  BASIC METABOLIC PANEL - Abnormal; Notable for the following:    CO2 21 (*)    Glucose, Bld 106 (*)    All other components within normal limits  URINE MICROSCOPIC-ADD ON - Abnormal; Notable for the following:    Squamous Epithelial / LPF 6-30 (*)    Bacteria, UA RARE (*)    All other components within normal limits  POC URINE PREG, ED    EKG  EKG Interpretation None       Radiology [facility] Renal  Result Date: 05/12/2016 CLINICAL DATA:  Right flank pain. Pain started last night. Previous right ureteral stones with stent placement. EXAM: RENAL / URINARY TRACT ULTRASOUND COMPLETE COMPARISON:  CT abdomen and pelvis 12/31/2015 FINDINGS: Right Kidney: Length: 11.3 cm. Echogenicity within normal  limits. No mass or hydronephrosis visualized. Left Kidney: Length: 10.5 cm. Echogenicity within normal limits. No mass or hydronephrosis visualized. Bladder: Appears normal for degree of bladder distention. IMPRESSION: Normal ultrasound appearance of the kidneys and bladder. No hydronephrosis. Electronically Signed   By: Burman NievesWilliam  Stevens M.D.   On: 05/12/2016 05:06    Procedures Procedures (including critical care time)  Medications Ordered in ED Medications  morphine 4 MG/ML injection 4 mg (4 mg Intravenous Given 05/12/16 0420)  sodium chloride 0.9 % bolus 1,000 mL (0 mLs Intravenous Stopped 05/12/16 0542)  ondansetron (ZOFRAN) injection 4 mg (4 mg Intravenous Given 05/12/16 0415)  ketorolac (TORADOL) 30 MG/ML injection 30 mg (30 mg Intravenous Given 05/12/16 0549)     Initial Impression / Assessment and Plan / ED Course  I have reviewed the triage vital signs and the nursing notes.  Pertinent labs & imaging results that were available during my care of the patient were reviewed by me and considered in my medical decision making (see chart for details).  Clinical Course    Emily Silva is a 25 y.o. female who presents to ED for right flank pain c/w her typical kidney  stones. On exam, patient is afebrile and nontoxic appearing with no abdominal tenderness. She does have tenderness to the right flank, no CVA tenderness.   Patient states that she has had several CT scans for stones and is worried about the risks associated with multiple CT scans. Will order renal US for further evaluation of today's symptoms which are likely 2/2 kidney stone.   Labs reviewed and reassuring, urine with Harshman applied, but no signs of infection. White count of 10.7. Renal ultrasound obtained:   IMPRESSION:  Normal ultrasound appearance of the kidneys and bladder. No  hydronephrosis.   Patient reevaluated following fluids, Zofran, morphine and pain is much improved. Possible she has passed stone already. Patient tolerated PO without episode of emesis. She is followed by urology and states that she can follow-up with them in a timely fashion. Repeat abdominal exam again with no abdominal tenderness. Reasons to return to ER were discussed and all questions answered.   Final Clinical Impressions(s) / ED Diagnoses   Final diagnoses:  Right flank pain  Renal colic on right side    New Prescriptions Discharge Medication List as of 05/12/2016  6:29 AM    START taking these medications   Details  naproxen (NAPROSYN) 500 MG tablet Take 1 tablet (500 mg total) by mouth 2 (two) times daily as needed for moderate pain., Starting Fri 05/12/2016, Print         [facility] CenterJaime Pilcher Ward, PA-C 05/12/16 0754    Layla MawKristen N Ward, DO 05/15/16 2348

## 2016-05-12 NOTE — ED Triage Notes (Signed)
Pt states she has hx of kidney stones and sees urologist; pt states she seen urologist a couple of weeks ago who stated a Kidney stone may be forming; pt states right flank pain started last night; pt c/o pain 8/10 on arrival. Pt a&ox 4 on arrival.

## 2016-05-12 NOTE — Discharge Instructions (Signed)
Please follow up with your urologist for discussion of today's ER visit.  Zofran as needed for nausea. Naproxen for mild to moderate pain. Pain medication only as needed for severe pain - This can make you very drowsy - please do not drink alcohol, operate heavy machinery or drive on this medication.  Return to ER for vomiting, fevers, new or worsening symptoms, any additional concerns.

## 2016-05-12 NOTE — ED Notes (Signed)
Pt given ginger ale & crackers  

## 2016-05-12 NOTE — ED Notes (Signed)
Pt departed in NAD, refused use of wheelchair.  

## 2016-11-20 ENCOUNTER — Ambulatory Visit (INDEPENDENT_AMBULATORY_CARE_PROVIDER_SITE_OTHER): Payer: Managed Care, Other (non HMO) | Admitting: Physician Assistant

## 2016-11-20 ENCOUNTER — Encounter: Payer: Self-pay | Admitting: Physician Assistant

## 2016-11-20 VITALS — BP 128/83 | HR 84 | Temp 98.6°F | Resp 16 | Ht 70.0 in | Wt 237.0 lb

## 2016-11-20 DIAGNOSIS — R112 Nausea with vomiting, unspecified: Secondary | ICD-10-CM | POA: Diagnosis not present

## 2016-11-20 DIAGNOSIS — R109 Unspecified abdominal pain: Secondary | ICD-10-CM

## 2016-11-20 LAB — POCT URINALYSIS DIP (MANUAL ENTRY)
Bilirubin, UA: NEGATIVE
Blood, UA: NEGATIVE
Glucose, UA: NEGATIVE mg/dL
Ketones, POC UA: NEGATIVE mg/dL
Leukocytes, UA: NEGATIVE
Nitrite, UA: NEGATIVE
Protein Ur, POC: NEGATIVE mg/dL
Spec Grav, UA: 1.02 (ref 1.010–1.025)
Urobilinogen, UA: 0.2 E.U./dL
pH, UA: 7.5 (ref 5.0–8.0)

## 2016-11-20 LAB — POCT CBC
Granulocyte percent: 70.4 %G (ref 37–80)
HCT, POC: 39.4 % (ref 37.7–47.9)
Hemoglobin: 13.3 g/dL (ref 12.2–16.2)
Lymph, poc: 2.3 (ref 0.6–3.4)
MCH, POC: 27 pg (ref 27–31.2)
MCHC: 33.7 g/dL (ref 31.8–35.4)
MCV: 80 fL (ref 80–97)
MID (cbc): 0.4 (ref 0–0.9)
MPV: 8.1 fL (ref 0–99.8)
POC Granulocyte: 6.3 (ref 2–6.9)
POC LYMPH PERCENT: 25.4 %L (ref 10–50)
POC MID %: 4.2 %M (ref 0–12)
Platelet Count, POC: 262 10*3/uL (ref 142–424)
RBC: 4.91 M/uL (ref 4.04–5.48)
RDW, POC: 14.6 %
WBC: 8.9 10*3/uL (ref 4.6–10.2)

## 2016-11-20 LAB — POC MICROSCOPIC URINALYSIS (UMFC): Mucus: ABSENT

## 2016-11-20 LAB — POCT URINE PREGNANCY: Preg Test, Ur: NEGATIVE

## 2016-11-20 MED ORDER — KETOROLAC TROMETHAMINE 60 MG/2ML IM SOLN
60.0000 mg | Freq: Once | INTRAMUSCULAR | Status: AC
  Administered 2016-11-20: 60 mg via INTRAMUSCULAR

## 2016-11-20 MED ORDER — ONDANSETRON HCL 4 MG PO TABS: 4.0000 mg | ORAL_TABLET | Freq: Three times a day (TID) | ORAL | 0 refills | Status: DC | PRN

## 2016-11-20 MED ORDER — TAMSULOSIN HCL 0.4 MG PO CAPS: 0.4000 mg | ORAL_CAPSULE | Freq: Every day | ORAL | 0 refills | Status: DC

## 2016-11-20 MED ORDER — NAPROXEN 500 MG PO TABS: 500.0000 mg | ORAL_TABLET | Freq: Two times a day (BID) | ORAL | 0 refills | Status: DC

## 2016-11-20 MED ORDER — ONDANSETRON 4 MG PO TBDP
4.0000 mg | ORAL_TABLET | Freq: Once | ORAL | Status: AC
  Administered 2016-11-20: 4 mg via ORAL

## 2016-11-20 MED ORDER — HYDROCODONE-ACETAMINOPHEN 5-325 MG PO TABS: 1.0000 | ORAL_TABLET | Freq: Four times a day (QID) | ORAL | 0 refills | Status: DC | PRN

## 2016-11-20 NOTE — Progress Notes (Signed)
11/20/2016 5:27 PM   DOB: 24-Aug-1990 / MRN: [id number]  SUBJECTIVE:  Emily Silva is a 26 y.o. female presenting for left sharp flank pain and urinary urgency that started on3 days ago.  She tells me that this did seem to get worse but is relieved with movement. She has tried tylenol a narcotic, and she has been vigoursly hydrating however has also been throwing up, roughly 3 times in the last 24 hours.   Denies vaginal abnormal vagina discharge today. She has a history of kidney stone and sees urology for this.   She is allergic to nickel.   She  has a past medical history of ADHD (attention deficit hyperactivity disorder); Anxiety disorder; Frequency of urination; Hematuria; History of kidney stones; Renal disorder; and Right ureteral stone.    She  reports that she quit smoking about 2 years ago. Her smoking use included Cigarettes. She has never used smokeless tobacco. She reports that she does not drink alcohol or use drugs. She  reports that she does not engage in sexual activity. The patient  has a past surgical history that includes Wisdom tooth extraction; Cystoscopy with ureteroscopy and stent placement (Right, 03/17/2013); and Rhinoplasty.  Her family history includes Cancer in her father.  Review of Systems  Constitutional: Negative for chills and fever.  Respiratory: Negative for shortness of breath.   Cardiovascular: Negative for chest pain and leg swelling.  Gastrointestinal: Positive for nausea.  Genitourinary: Positive for flank pain, frequency, hematuria and urgency. Negative for dysuria.  Neurological: Negative for dizziness.    The problem list and medications were reviewed and updated by myself where necessary and exist elsewhere in the encounter.   OBJECTIVE:  BP 128/83 (BP Location: Right Arm, Patient Position: Sitting, Cuff Size: Large)   Pulse 84   Temp 98.6 F (37 C) (Oral)   Resp 16   Ht 5\' 10"  (1.778 m)   Wt 237 lb (107.5 kg)   LMP 11/09/2016   SpO2  100%   BMI 34.01 kg/m   Physical Exam  Constitutional: She is active.  Non-toxic appearance.  Cardiovascular: Normal rate, regular rhythm, S1 normal, S2 normal, normal heart sounds and intact distal pulses.  Exam reveals no gallop, no friction rub and no decreased pulses.   No murmur heard. Pulmonary/Chest: Effort normal. No stridor. No tachypnea. No respiratory distress. She has no wheezes. She has no rales.  Abdominal: Soft. Normal appearance and bowel sounds are normal. She exhibits no distension and no mass. There is no tenderness. There is CVA tenderness (left side, negative for rash). There is no rigidity, no rebound, no guarding, no tenderness at McBurney's point and negative Murphy's sign.  Musculoskeletal: She exhibits no edema.  Neurological: She is alert.  Skin: Skin is warm and dry. She is not diaphoretic. No pallor.    Results for orders placed or performed in visit on 11/20/16 (from the past 72 hour(s))  POCT urinalysis dipstick     Status: None   Collection Time: 11/20/16  3:35 PM  Result Value Ref Range   Color, UA yellow yellow   Clarity, UA clear clear   Glucose, UA negative negative mg/dL   Bilirubin, UA negative negative   Ketones, POC UA negative negative mg/dL   Spec Grav, UA 4.0981.020 1.1911.010 - 1.025   Blood, UA negative negative   pH, UA 7.5 5.0 - 8.0   Protein Ur, POC negative negative mg/dL   Urobilinogen, UA 0.2 0.2 or 1.0 E.U./dL   Nitrite, UA  Negative Negative   Leukocytes, UA Negative Negative  POCT urine pregnancy     Status: None   Collection Time: 11/20/16  4:22 PM  Result Value Ref Range   Preg Test, Ur Negative Negative  POCT CBC     Status: None   Collection Time: 11/20/16  4:33 PM  Result Value Ref Range   WBC 8.9 4.6 - 10.2 K/uL   Lymph, poc 2.3 0.6 - 3.4   POC LYMPH PERCENT 25.4 10 - 50 %L   MID (cbc) 0.4 0 - 0.9   POC MID % 4.2 0 - 12 %M   POC Granulocyte 6.3 2 - 6.9   Granulocyte percent 70.4 37 - 80 %G   RBC 4.91 4.04 - 5.48 M/uL    Hemoglobin 13.3 12.2 - 16.2 g/dL   HCT, POC 40.9 81.1 - 47.9 %   MCV 80.0 80 - 97 fL   MCH, POC 27.0 27 - 31.2 pg   MCHC 33.7 31.8 - 35.4 g/dL   RDW, POC 91.4 %   Platelet Count, POC 262 142 - 424 K/uL   MPV 8.1 0 - 99.8 fL    No results found.  ASSESSMENT AND PLAN:  Lallie was seen today for flank pain and emesis.  Diagnoses and all orders for this visit:  Flank pain: She is negative for blood on her dip, however she has a history of nephrolithiasis and these symptoms are consistent.  She denies any vaginal symptoms today.  She has a urology appointment coming up on Friday, possibly sooner.  CT stone study pending.  I would have liked to hydrate this patient today however we tried 4 times and were unable to establish.  Advised she take zofran ad vigorously hydrate, if unsuccessful will see her back and potentially try again.  Fortunately her vitals are stable.  -     POCT urinalysis dipstick -     POCT Microscopic Urinalysis ([facility]) -     Basic metabolic panel -     Insert peripheral IV -     POCT CBC -     ketorolac (TORADOL) injection 60 mg; Inject 2 mLs (60 mg total) into the muscle once. -     POCT urine pregnancy -     CT RENAL STONE STUDY; Future -     HYDROcodone-acetaminophen (NORCO) 5-325 MG tablet; Take 1 tablet by mouth every 6 (six) hours as needed for severe pain (May cause constipation). For severe pain only. -     naproxen (NAPROSYN) 500 MG tablet; Take 1 tablet (500 mg total) by mouth 2 (two) times daily with a meal.       -     tamsulosin (FLOMAX) 0.4 MG CAPS capsule; Take 1 capsule (0.4 mg total) by mouth daily after            supper.  Nausea and vomiting, intractability of vomiting not specified, unspecified vomiting type -     ondansetron (ZOFRAN-ODT) disintegrating tablet 4 mg; Take 1 tablet (4 mg total) by mouth once. -     ondansetron (ZOFRAN) 4 MG tablet; Take 1 tablet (4 mg total) by mouth every 8 (eight) hours as needed for nausea or vomiting.    The  patient is advised to call or return to clinic if she does not see an improvement in symptoms, or to seek the care of the closest emergency department if she worsens with the above plan.   Deliah Boston, MHS, PA-C [facility] 11/20/2016 5:27 PM   Addendum: Pharmacy called and reported that the patient has a history of subcutex use and is also prescribed xanax.  Advised to destroy the prescription and I called to patient who obviously became upset however advised that the risk of narcotic therapy in conjunction with benzodiazepine therapy and a history of narcotic addiction is simply not worth the risk.  I apologized for not catching this sooner.

## 2016-11-20 NOTE — Progress Notes (Signed)
2 unsuccessful iv attempts right hand and right antecubital

## 2016-11-20 NOTE — Patient Instructions (Addendum)
  Try to hydrate vigorously over the next 24-36 hours.    IF you received an x-ray today, you will receive an invoice from [facility]. Please contact [facility] at [phone] with questions or concerns regarding your invoice.   IF you received labwork today, you will receive an invoice from [facility]. Please contact [facility] at [phone] with questions or concerns regarding your invoice.   Our billing staff will not be able to assist you with questions regarding bills from these companies.  You will be contacted with the lab results as soon as they are available. The fastest way to get your results is to activate your My Chart account. Instructions are located on the last page of this paperwork. If you have not heard from us regarding the results in 2 weeks, please contact this office.

## 2016-11-21 ENCOUNTER — Telehealth: Payer: Self-pay | Admitting: Physician Assistant

## 2016-11-21 LAB — BASIC METABOLIC PANEL
BUN/Creatinine Ratio: 14 (ref 9–23)
BUN: 12 mg/dL (ref 6–20)
CO2: 24 mmol/L (ref 18–29)
Calcium: 10.4 mg/dL — ABNORMAL HIGH (ref 8.7–10.2)
Chloride: 101 mmol/L (ref 96–106)
Creatinine, Ser: 0.87 mg/dL (ref 0.57–1.00)
GFR calc Af Amer: 107 mL/min/{1.73_m2} (ref >59)
GFR calc non Af Amer: 93 mL/min/{1.73_m2} (ref >59)
Glucose: 100 mg/dL — ABNORMAL HIGH (ref 65–99)
Potassium: 4.8 mmol/L (ref 3.5–5.2)
Sodium: 139 mmol/L (ref 134–144)

## 2016-11-21 NOTE — Telephone Encounter (Signed)
Pt is concerned with the pain & has not been able to get in to the urologist, she states that her medication was cancelled by her pharmacist. She says that the shot of trodol really helped her with the pain more than anything else. She says she likes to move forward with Dr. Chestine Sporelark  With him calling her psychiatrist about the medication she needs or with the  pharmacy coordinator Marcelino DusterMichelle (possibly) [phone] this is the direct #) & the office # is [phone]. She just wants to make sure that this doesn't happen again & because of all these pains. She just needs a doc on hand that can handle all these things about her & taking precautions. She is curious on what she should do about the CT Scan & how to get that scheduled.

## 2016-11-23 ENCOUNTER — Encounter: Payer: Self-pay | Admitting: Radiology

## 2016-11-23 NOTE — Telephone Encounter (Signed)
She has gotten all meds except vicodin Due to an rx received somewhere else Confirmed with pharmacy  Can we see about the ct scan and urology referral?

## 2016-11-23 NOTE — Progress Notes (Signed)
Please make patient aware of results via letter. In the context of her overall presentation any abnormal values are of no clinical significance.  Deliah BostonMichael Rendi Mapel PA-C, 11/23/2016 1:55 PM

## 2016-11-23 NOTE — Telephone Encounter (Signed)
I will not be writing narcotics for this patient under any circumstances.  She is high risk due to a history of addicition and also takes xanax.  She has a urologist and told me she would be seeing them on Friday.  The scan is set as non stat but is in the hands of referrals right now. Deliah BostonMichael Clark, MS, PA-C 1:51 PM, 11/23/2016

## 2016-11-23 NOTE — Telephone Encounter (Signed)
L/m with clarks note  Call back with urologist info  We will work on getting the ct scan scheduled

## 2016-12-01 NOTE — Telephone Encounter (Signed)
CT Authorized. Authorization number is [id number] effective from 12/01/16 to 03/01/17. Will fax order to [facility].

## 2016-12-08 ENCOUNTER — Encounter: Payer: Managed Care, Other (non HMO) | Admitting: Physician Assistant

## 2017-07-25 DIAGNOSIS — F32A Depression, unspecified: Secondary | ICD-10-CM | POA: Insufficient documentation

## 2017-07-25 DIAGNOSIS — F329 Major depressive disorder, single episode, unspecified: Secondary | ICD-10-CM | POA: Insufficient documentation

## 2018-08-01 ENCOUNTER — Ambulatory Visit
Admission: EM | Admit: 2018-08-01 | Discharge: 2018-08-01 | Disposition: A | Discharge status: Home / Self Care | Payer: 59 | Attending: Family Medicine | Admitting: Family Medicine

## 2018-08-01 ENCOUNTER — Encounter: Payer: Self-pay | Admitting: Emergency Medicine

## 2018-08-01 DIAGNOSIS — R109 Unspecified abdominal pain: Secondary | ICD-10-CM | POA: Diagnosis present

## 2018-08-01 DIAGNOSIS — J019 Acute sinusitis, unspecified: Secondary | ICD-10-CM | POA: Diagnosis not present

## 2018-08-01 LAB — POCT URINALYSIS DIP (MANUAL ENTRY)
Bilirubin, UA: NEGATIVE
Glucose, UA: NEGATIVE mg/dL
Ketones, POC UA: NEGATIVE mg/dL
Leukocytes, UA: NEGATIVE
Nitrite, UA: NEGATIVE
Protein Ur, POC: NEGATIVE mg/dL
Spec Grav, UA: 1.025 (ref 1.010–1.025)
Urobilinogen, UA: 0.2 E.U./dL
pH, UA: 7 (ref 5.0–8.0)

## 2018-08-01 LAB — POCT URINE PREGNANCY: Preg Test, Ur: NEGATIVE

## 2018-08-01 LAB — POCT RAPID STREP A (OFFICE): Rapid Strep A Screen: NEGATIVE

## 2018-08-01 MED ORDER — INDOMETHACIN 50 MG PO CAPS: 50.0000 mg | ORAL_CAPSULE | Freq: Two times a day (BID) | ORAL | 0 refills | Status: AC

## 2018-08-01 MED ORDER — TAMSULOSIN HCL 0.4 MG PO CAPS: 0.4000 mg | ORAL_CAPSULE | Freq: Every day | ORAL | 0 refills | Status: AC

## 2018-08-01 MED ORDER — HYDROCODONE-HOMATROPINE 5-1.5 MG/5ML PO SYRP: 5.0000 mL | ORAL_SOLUTION | Freq: Every evening | ORAL | 0 refills | Status: DC | PRN

## 2018-08-01 MED ORDER — KETOROLAC TROMETHAMINE 60 MG/2ML IM SOLN
60.0000 mg | Freq: Once | INTRAMUSCULAR | Status: AC
Start: 2018-08-01 — End: 2018-08-01
  Administered 2018-08-01: 60 mg via INTRAMUSCULAR

## 2018-08-01 MED ORDER — ONDANSETRON 4 MG PO TBDP: 4.0000 mg | ORAL_TABLET | Freq: Three times a day (TID) | ORAL | 0 refills | Status: DC | PRN

## 2018-08-01 MED ORDER — CETIRIZINE HCL 10 MG PO CAPS: 10.0000 mg | ORAL_CAPSULE | Freq: Every day | ORAL | 0 refills | Status: AC

## 2018-08-01 MED ORDER — AMOXICILLIN-POT CLAVULANATE 875-125 MG PO TABS: 1.0000 | ORAL_TABLET | Freq: Two times a day (BID) | ORAL | 0 refills | Status: AC

## 2018-08-01 NOTE — ED Provider Notes (Signed)
[facility]    CSN: [id number] Arrival date & time: 08/01/18  1801     History   Chief Complaint Chief Complaint  Patient presents with  . Flank Pain  . throat pain    HPI Emily Silva is a 28 y.o. female history of kidney stones presenting today for evaluation of right flank pain and URI symptoms.  Her main complaint is the pain that she has having in her right flank and back.  States that it is constantly a dull ache immobile, sharp razor blade like pain at times.  She is also had some hematuria and some mild dysuria and noted her urine to be cloudy.  She has a history of kidney stones as well as recurrent UTIs.  She is seen urology in the past previously.  She states that symptoms feel more like a kidney stone than a UTI.  She has had some nausea, but denies vomiting.  Denies fevers.  Patient was also concerned about sore throat and cough.  She states that she has had cold symptoms over the past 2 weeks.  She has been taking Tessalon, Mucinex and Tylenol without relief.  Denies any chest pain or shortness of breath.  She is concerned about strep.  Patient is currently on buprenorphine-patient questioned about this after she requested narcotics states that she received plastic surgery in 2016 and initially was taking pain medicine consistently, her doctor has transitioned her to the Subutex and has plans to eventually go off of this.  She denies any other issues with opioids.  She states that she does not have a pain contract, her doctor does allow her to have controlled substances for kidney stone pain.  She also states that she is previously had relief with cough with a stronger cough syrup.  HPI  Past Medical History:  Diagnosis Date  . ADHD (attention deficit hyperactivity disorder)   . Anxiety disorder   . Frequency of urination   . Hematuria   . History of kidney stones   . Renal disorder    Kidney stones  . Right ureteral stone     Patient Active  Problem List   Diagnosis Date Noted  . ADHD (attention deficit hyperactivity disorder) 03/15/2015  . Anxiety disorder 03/15/2015  . Ureteral calculus, right 03/16/2013    Past Surgical History:  Procedure Laterality Date  . CYSTOSCOPY WITH URETEROSCOPY AND STENT PLACEMENT Right 03/17/2013   Procedure: CYSTOSCOPY WITH RIGHT URETEROSCOPY HOLMIUM LASER OF STONESER LITHOTRIPSY AND STENT PLACEMENT;  Surgeon: Garnett FarmMark C Ottelin, MD;  [facility];  Service: Urology;  Laterality: Right;  . RHINOPLASTY    . WISDOM TOOTH EXTRACTION      OB History   No obstetric history on file.      Home Medications    Prior to Admission medications   Medication Sig Start Date End Date Taking? Authorizing Provider  ALPRAZolam Prudy Feeler(XANAX) 1 MG tablet Take 1 mg by mouth 3 (three) times daily as needed for anxiety.   Yes [provider]  amphetamine-dextroamphetamine (ADDERALL) 10 MG tablet Take 10 mg by mouth daily with breakfast.   Yes [provider]  buprenorphine (SUBUTEX) 8 MG SUBL SL tablet Place 8-12 mg under the tongue daily.  10/23/14  Yes [provider]  amoxicillin-clavulanate (AUGMENTIN) 875-125 MG tablet Take 1 tablet by mouth every 12 (twelve) hours for 10 days. 08/01/18 08/11/18  Wieters, Hallie C, PA-C  Cetirizine HCl 10 MG CAPS Take 1 capsule (10 mg  total) by mouth daily for 10 days. 08/01/18 08/11/18  Wieters, Hallie C, PA-C  HYDROcodone-homatropine (HYCODAN) 5-1.5 MG/5ML syrup Take 5 mLs by mouth at bedtime as needed for cough. 08/01/18   Wieters, Hallie C, PA-C  indomethacin (INDOCIN) 50 MG capsule Take 1 capsule (50 mg total) by mouth 2 (two) times daily with a meal. 08/01/18   Wieters, Hallie C, PA-C  ondansetron (ZOFRAN-ODT) 4 MG disintegrating tablet Take 1 tablet (4 mg total) by mouth every 8 (eight) hours as needed for nausea or vomiting. 08/01/18   Wieters, Hallie C, PA-C  tamsulosin (FLOMAX) 0.4 MG CAPS capsule Take 1 capsule (0.4 mg total) by mouth  daily for 10 days. 08/01/18 08/11/18  Wieters, Junius Creamer, PA-C    Family History Family History  Problem Relation Age of Onset  . Cancer Father     Social History Social History   Tobacco Use  . Smoking status: Former Smoker    Types: Cigarettes    Last attempt to quit: 12/31/2013    Years since quitting: 4.5  . Smokeless tobacco: Never Used  . Tobacco comment: occasional smoker of cigarettes and e-cig since 2012  Substance Use Topics  . Alcohol use: No    Comment: occasional  . Drug use: No     Allergies   Nickel   Review of Systems Review of Systems  Constitutional: Negative for activity change, appetite change, chills, fatigue and fever.  HENT: Positive for congestion, rhinorrhea and sore throat. Negative for ear pain, sinus pressure and trouble swallowing.   Eyes: Negative for discharge and redness.  Respiratory: Positive for cough. Negative for chest tightness and shortness of breath.   Cardiovascular: Negative for chest pain.  Gastrointestinal: Negative for abdominal pain, diarrhea, nausea and vomiting.  Genitourinary: Positive for dysuria, flank pain and hematuria.  Musculoskeletal: Positive for back pain. Negative for myalgias.  Skin: Negative for rash.  Neurological: Negative for dizziness, light-headedness and headaches.     Physical Exam Triage Vital Signs ED Triage Vitals [08/01/18 1804]  Enc Vitals Group     BP (!) 130/96     Pulse Rate 78     Resp 18     Temp 98.3 F (36.8 C)     Temp Source Oral     SpO2 95 %     Weight      Height      Head Circumference      Peak Flow      Pain Score 6     Pain Loc      Pain Edu?      Excl. in GC?    No data found.  Updated Vital Signs BP (!) 130/96 (BP Location: Left Arm)   Pulse 78   Temp 98.3 F (36.8 C) (Oral)   Resp 18   LMP 07/11/2018   SpO2 95%   Visual Acuity Right Eye Distance:   Left Eye Distance:   Bilateral Distance:    Right Eye Near:   Left Eye Near:    Bilateral Near:      Physical Exam Vitals signs and nursing note reviewed.  Constitutional:      General: She is not in acute distress.    Appearance: She is well-developed.  HENT:     Head: Normocephalic and atraumatic.     Ears:     Comments: Bilateral ears without tenderness to palpation of external auricle, tragus and mastoid, EAC's without erythema or swelling, TM's with good bony landmarks and cone of light.  Non erythematous.    Mouth/Throat:     Comments: Oral mucosa pink and moist, no tonsillar enlargement or exudate. Posterior pharynx patent and nonerythematous, no uvula deviation or swelling. Normal phonation. Eyes:     Conjunctiva/sclera: Conjunctivae normal.  Neck:     Musculoskeletal: Neck supple.  Cardiovascular:     Rate and Rhythm: Normal rate and regular rhythm.     Heart sounds: No murmur.  Pulmonary:     Effort: Pulmonary effort is normal. No respiratory distress.     Breath sounds: Normal breath sounds.     Comments: Breathing comfortably at rest, CTABL, no wheezing, rales or other adventitious sounds auscultated Abdominal:     Palpations: Abdomen is soft.     Tenderness: There is no abdominal tenderness.     Comments: No CVA tenderness  Skin:    General: Skin is warm and dry.  Neurological:     Mental Status: She is alert.      UC Treatments / Results  Labs (all labs ordered are listed, but only abnormal results are displayed) Labs Reviewed  POCT URINALYSIS DIP (MANUAL ENTRY) - Abnormal; Notable for the following components:      Result Value   Blood, UA large (*)    All other components within normal limits  CULTURE, GROUP A STREP [facility]  POCT URINE PREGNANCY  POCT RAPID STREP A (OFFICE)    EKG None  Radiology No results found.  Procedures Procedures (including critical care time)  Medications Ordered in UC Medications  ketorolac (TORADOL) injection 60 mg (60 mg Intramuscular Given 08/01/18 1834)    Initial Impression / Assessment and Plan / UC Course   I have reviewed the triage vital signs and the nursing notes.  Pertinent labs & imaging results that were available during my care of the patient were reviewed by me and considered in my medical decision making (see chart for details).     UA negative for leuks and nitrites, large hemoglobin.  Given symptoms, more suggestive of kidney stone.  Will treat with Toradol prior to discharge, Flomax, indomethacin and Zofran.  Follow-up in emergency room if symptoms worsening or not passing.  URI symptoms x2 weeks, will cover for sinusitis, lungs clear, vital signs stable, do not suspect underlying pneumonia.  Will recommend further symptomatic management.  After discussion with patient about her previous opioid use as she has a high opioid risk score, did opt to provide patient with Hycodan cough syrup.  Opted for this to hopefully help with any worsening pain related to kidney stone as well as cough versus straight hydrocodone.  She seemed to have consistent story as to what was reported in her controlled substance registry.  Stressed importance about limiting use, contact Dr. Darlys Galeseedy in the morning to inform of this prescription if needed to alter the buprenorphine.  Discussed strict return precautions. Patient verbalized understanding and is agreeable with plan.  Final Clinical Impressions(s) / UC Diagnoses   Final diagnoses:  Acute sinusitis with symptoms > 10 days  Right flank pain     Discharge Instructions     Urine is suggestive of a kidney stone We gave you a shot of Toradol today, please continue to use indomethacin twice daily at home for the pain Begin Flomax daily to help open ureter and help stone pass Zofran as needed for nausea and vomiting If pain worsening or having persistent nausea or vomiting, developing fever, please go to the emergency room for further treatment  Please begin taking Augmentin  to treat for sinus infection Continue Tessalon for cough or over-the-counter  Delsym/Robitussin-DM Cetirizine daily to help with congestion any drainage that is further irritating your throat, please take this daily for the next 1 to 2 weeks  Please continue to monitor your symptoms and follow-up if symptoms worsening, developing fever, and shortness of breath or difficulty breathing   ED Prescriptions    Medication Sig Dispense Auth. Provider   tamsulosin (FLOMAX) 0.4 MG CAPS capsule Take 1 capsule (0.4 mg total) by mouth daily for 10 days. 10 capsule Wieters, Hallie C, PA-C   ondansetron (ZOFRAN-ODT) 4 MG disintegrating tablet Take 1 tablet (4 mg total) by mouth every 8 (eight) hours as needed for nausea or vomiting. 24 tablet Wieters, Hallie C, PA-C   indomethacin (INDOCIN) 50 MG capsule Take 1 capsule (50 mg total) by mouth 2 (two) times daily with a meal. 20 capsule Wieters, Hallie C, PA-C   amoxicillin-clavulanate (AUGMENTIN) 875-125 MG tablet Take 1 tablet by mouth every 12 (twelve) hours for 10 days. 20 tablet Wieters, Hallie C, PA-C   Cetirizine HCl 10 MG CAPS Take 1 capsule (10 mg total) by mouth daily for 10 days. 10 capsule Wieters, Hallie C, PA-C   HYDROcodone-homatropine (HYCODAN) 5-1.5 MG/5ML syrup Take 5 mLs by mouth at bedtime as needed for cough. 70 mL Wieters, Hallie C, PA-C     Controlled Substance Prescriptions [place] Controlled Substance Registry consulted? Yes, I have consulted the [place] Controlled Substances Registry for this patient, and feel the risk/benefit ratio today is favorable for proceeding with this prescription for a controlled substance.   Lew Dawes, [place] 08/01/18 2004

## 2018-08-01 NOTE — ED Notes (Signed)
Patient able to ambulate independently.  Patient requesting pain medication at d/c, will make provider aware.

## 2018-08-01 NOTE — ED Triage Notes (Signed)
Pt presents to [facility] for assessment of 1 week of right flank pain (moving down), blood in urine, hx of kidney stones.  States she has also had a sore throat and cough x 2 weeks.

## 2018-08-01 NOTE — ED Notes (Signed)
Provider at bedside discussing pain control with patient at this time.

## 2018-08-01 NOTE — Discharge Instructions (Signed)
Urine is suggestive of a kidney stone We gave you a shot of Toradol today, please continue to use indomethacin twice daily at home for the pain Begin Flomax daily to help open ureter and help stone pass Zofran as needed for nausea and vomiting If pain worsening or having persistent nausea or vomiting, developing fever, please go to the emergency room for further treatment  Please begin taking Augmentin to treat for sinus infection Continue Tessalon for cough or over-the-counter Delsym/Robitussin-DM Cetirizine daily to help with congestion any drainage that is further irritating your throat, please take this daily for the next 1 to 2 weeks  Please continue to monitor your symptoms and follow-up if symptoms worsening, developing fever, and shortness of breath or difficulty breathing

## 2018-08-04 LAB — CULTURE, GROUP A STREP (THRC)

## 2018-08-05 ENCOUNTER — Telehealth (HOSPITAL_COMMUNITY): Payer: Self-pay | Admitting: Emergency Medicine

## 2018-08-05 NOTE — Telephone Encounter (Signed)
Culture is positive for non group A Strep germ.  This is a finding of uncertain significance; not the typical 'strep throat' germ.  Pt complains of persistent symptoms.  Rx for augmentin given at [facility] visit. Pt verbalized understanding. Recheck for further evaluation if symptoms are not improving  Attempted call no answer and LVMM

## 2018-08-27 ENCOUNTER — Ambulatory Visit: Payer: Self-pay | Admitting: Family Medicine

## 2018-10-14 ENCOUNTER — Other Ambulatory Visit: Payer: Self-pay

## 2018-10-14 ENCOUNTER — Emergency Department (INDEPENDENT_AMBULATORY_CARE_PROVIDER_SITE_OTHER)
Admission: EM | Admit: 2018-10-14 | Discharge: 2018-10-14 | Disposition: A | Discharge status: Home / Self Care | Payer: 59 | Source: Home / Self Care

## 2018-10-14 DIAGNOSIS — Z87442 Personal history of urinary calculi: Secondary | ICD-10-CM

## 2018-10-14 DIAGNOSIS — R109 Unspecified abdominal pain: Secondary | ICD-10-CM | POA: Diagnosis not present

## 2018-10-14 DIAGNOSIS — R300 Dysuria: Secondary | ICD-10-CM

## 2018-10-14 LAB — POCT URINALYSIS DIP (MANUAL ENTRY)
Bilirubin, UA: NEGATIVE
Glucose, UA: NEGATIVE mg/dL
Leukocytes, UA: NEGATIVE
Nitrite, UA: NEGATIVE
Protein Ur, POC: NEGATIVE mg/dL
Spec Grav, UA: >=1.03 — AB (ref 1.010–1.025)
Urobilinogen, UA: 0.2 E.U./dL
pH, UA: 5.5 (ref 5.0–8.0)

## 2018-10-14 MED ORDER — KETOROLAC TROMETHAMINE 60 MG/2ML IM SOLN
60.0000 mg | Freq: Once | INTRAMUSCULAR | Status: AC
  Administered 2018-10-14: 60 mg via INTRAMUSCULAR

## 2018-10-14 MED ORDER — ONDANSETRON 4 MG PO TBDP: 4.0000 mg | ORAL_TABLET | Freq: Three times a day (TID) | ORAL | 0 refills | Status: DC | PRN

## 2018-10-14 MED ORDER — PHENAZOPYRIDINE HCL 200 MG PO TABS: 200.0000 mg | ORAL_TABLET | Freq: Three times a day (TID) | ORAL | 0 refills | Status: DC

## 2018-10-14 MED ORDER — TAMSULOSIN HCL 0.4 MG PO CAPS: 0.4000 mg | ORAL_CAPSULE | Freq: Every day | ORAL | 0 refills | Status: DC

## 2018-10-14 NOTE — Discharge Instructions (Signed)
°  Pyridium has been prescribed to help with urinary discomfort and bladder spasms.  This medication can cause your urine to be orange, this is normal. If your insurance does not cover this medication, a similar over the counter medication can be taken instead. It is called Azo.  Please ask your pharmacist if you need assistance.  Please call your family doctor or follow up with your urologist for recheck of recurrent flank pain.  Call 911 or go to the hospital if symptoms are significantly worsening- significant persistent pain, unable to urinate, unable to keep down fluids, develop a persistent fever >100.4*F.

## 2018-10-14 NOTE — ED Provider Notes (Addendum)
[facility]    CSN: [id number] Arrival date & time: 10/14/18  1652     History   Chief Complaint Chief Complaint  Patient presents with  . Dysuria  . Nausea    HPI Emily Silva is a 28 y.o. female.   HPI  Emily Silva is a 28 y.o. female presenting to UC with c/o sudden onset dysuria and Right side flank pain that started last night, associated bloody cloudy urine this morning. Pt has a hx of recurrent kidney stones. She is followed by [facility] but has not needed to see them in a while. She also reports hx of recurrent UTIs. Mild nausea but no vomiting, diarrhea, fever, or chills.  Pain is 7/10 at worst. She tried taking leftover cough syrup- hycodan but no relief.  She states she normally gets a shot and pain medication when she gets these stones. She is not currently taking Flomax but per medical records, she has been prescribed this in the past.   Past Medical History:  Diagnosis Date  . ADHD (attention deficit hyperactivity disorder)   . Anxiety disorder   . Frequency of urination   . Hematuria   . History of kidney stones   . Renal disorder    Kidney stones  . Right ureteral stone     Patient Active Problem List   Diagnosis Date Noted  . ADHD (attention deficit hyperactivity disorder) 03/15/2015  . Anxiety disorder 03/15/2015  . Ureteral calculus, right 03/16/2013    Past Surgical History:  Procedure Laterality Date  . CYSTOSCOPY WITH URETEROSCOPY AND STENT PLACEMENT Right 03/17/2013   Procedure: CYSTOSCOPY WITH RIGHT URETEROSCOPY HOLMIUM LASER OF STONESER LITHOTRIPSY AND STENT PLACEMENT;  Surgeon: Garnett Farm, MD;  Location: [facility];  Service: Urology;  Laterality: Right;  . RHINOPLASTY    . WISDOM TOOTH EXTRACTION      OB History   No obstetric history on file.      Home Medications    Prior to Admission medications   Medication Sig Start Date End Date Taking? Authorizing Provider  ALPRAZolam Prudy Feeler) 1  MG tablet Take 1 mg by mouth 3 (three) times daily as needed for anxiety.    [provider]  amphetamine-dextroamphetamine (ADDERALL) 10 MG tablet Take 10 mg by mouth daily with breakfast.    [provider]  buprenorphine (SUBUTEX) 8 MG SUBL SL tablet Place 8-12 mg under the tongue daily.  10/23/14   [provider]  Cetirizine HCl 10 MG CAPS Take 1 capsule (10 mg total) by mouth daily for 10 days. 08/01/18 08/11/18  Wieters, Hallie C, PA-C  HYDROcodone-homatropine (HYCODAN) 5-1.5 MG/5ML syrup Take 5 mLs by mouth at bedtime as needed for cough. 08/01/18   Wieters, Hallie C, PA-C  indomethacin (INDOCIN) 50 MG capsule Take 1 capsule (50 mg total) by mouth 2 (two) times daily with a meal. 08/01/18   Wieters, Hallie C, PA-C  ondansetron (ZOFRAN ODT) 4 MG disintegrating tablet Take 1 tablet (4 mg total) by mouth every 8 (eight) hours as needed. 10/14/18   Lurene Shadow, PA-C  phenazopyridine (PYRIDIUM) 200 MG tablet Take 1 tablet (200 mg total) by mouth 3 (three) times daily. 10/14/18   Lurene Shadow, PA-C  tamsulosin (FLOMAX) 0.4 MG CAPS capsule Take 1 capsule (0.4 mg total) by mouth daily. 10/14/18   Lurene Shadow, PA-C    Family History Family History  Problem Relation Age of Onset  . Cancer Father  Social History Social History   Tobacco Use  . Smoking status: Former Smoker    Types: Cigarettes    Last attempt to quit: 12/31/2013    Years since quitting: 4.7  . Smokeless tobacco: Never Used  . Tobacco comment: occasional smoker of cigarettes and e-cig since 2012  Substance Use Topics  . Alcohol use: No    Comment: occasional  . Drug use: No     Allergies   Nickel   Review of Systems Review of Systems  Constitutional: Negative for chills and fever.  Gastrointestinal: Positive for abdominal pain and nausea. Negative for diarrhea and vomiting.  Genitourinary: Positive for dysuria ( Right), flank pain and pelvic pain. Negative for decreased urine volume,  frequency and urgency.  Musculoskeletal: Positive for back pain. Negative for myalgias.  Neurological: Negative for dizziness and headaches.     Physical Exam Triage Vital Signs ED Triage Vitals  Enc Vitals Group     BP      Pulse      Resp      Temp      Temp src      SpO2      Weight      Height      Head Circumference      Peak Flow      Pain Score      Pain Loc      Pain Edu?      Excl. in GC?    No data found.  Updated Vital Signs BP (!) 148/95 (BP Location: Right Arm)   Pulse 90   Temp 97.7 F (36.5 C) (Oral)   Resp 20   Ht 5\' 11"  (1.803 m)   Wt 274 lb (124.3 kg)   LMP 09/29/2018   SpO2 100%   BMI 38.22 kg/m   Visual Acuity Right Eye Distance:   Left Eye Distance:   Bilateral Distance:    Right Eye Near:   Left Eye Near:    Bilateral Near:     Physical Exam Vitals signs and nursing note reviewed.  Constitutional:      Appearance: Normal appearance. She is well-developed.  HENT:     Head: Normocephalic and atraumatic.     Mouth/Throat:     Mouth: Mucous membranes are moist.  Neck:     Musculoskeletal: Normal range of motion.  Cardiovascular:     Rate and Rhythm: Normal rate and regular rhythm.  Pulmonary:     Effort: Pulmonary effort is normal.     Breath sounds: Normal breath sounds.  Abdominal:     General: There is no distension.     Palpations: Abdomen is soft.     Tenderness: There is no abdominal tenderness. There is no right CVA tenderness or left CVA tenderness.  Musculoskeletal: Normal range of motion.  Skin:    General: Skin is warm and dry.  Neurological:     Mental Status: She is alert and oriented to person, place, and time.  Psychiatric:        Behavior: Behavior normal.      UC Treatments / Results  Labs (all labs ordered are listed, but only abnormal results are displayed) Labs Reviewed  POCT URINALYSIS DIP (MANUAL ENTRY) - Abnormal; Notable for the following components:      Result Value   Ketones, POC UA trace  (5) (*)    Spec Grav, UA >=1.030 (*)    Blood, UA small (*)    All other components within normal limits  URINE CULTURE    EKG None  Radiology No results found.  Procedures Procedures (including critical care time)  Medications Ordered in UC Medications  ketorolac (TORADOL) injection 60 mg (60 mg Intramuscular Given 10/14/18 1725)    Initial Impression / Assessment and Plan / UC Course  I have reviewed the triage vital signs and the nursing notes.  Pertinent labs & imaging results that were available during my care of the patient were reviewed by me and considered in my medical decision making (see chart for details).     UA: no definite UTI, small blood noted in urine, could be c/w stones. Culture sent No CVAT Pt appears well, non-toxic.  Doubt obstruction Encouraged symptomatic tx with Flomax, pyridium, and OTC NSAIDs. Toradol 60mg  IM given in UC Encouraged good hydration F/u with PCP and urology.  Final Clinical Impressions(s) / UC Diagnoses   Final diagnoses:  Right flank pain  Dysuria  History of kidney stones     Discharge Instructions      Pyridium has been prescribed to help with urinary discomfort and bladder spasms.  This medication can cause your urine to be orange, this is normal. If your insurance does not cover this medication, a similar over the counter medication can be taken instead. It is called Azo.  Please ask your pharmacist if you need assistance.  Please call your family doctor or follow up with your urologist for recheck of recurrent flank pain.  Call 911 or go to the hospital if symptoms are significantly worsening- significant persistent pain, unable to urinate, unable to keep down fluids, develop a persistent fever >100.4*F.     ED Prescriptions    Medication Sig Dispense Auth. Provider   tamsulosin (FLOMAX) 0.4 MG CAPS capsule Take 1 capsule (0.4 mg total) by mouth daily. 30 capsule Waylan RocherPhelps, Givanni Staron O, PA-C   phenazopyridine  (PYRIDIUM) 200 MG tablet Take 1 tablet (200 mg total) by mouth 3 (three) times daily. 6 tablet Doroteo GlassmanPhelps, Anais Koenen O, PA-C   ondansetron (ZOFRAN ODT) 4 MG disintegrating tablet Take 1 tablet (4 mg total) by mouth every 8 (eight) hours as needed. 8 tablet Lurene ShadowPhelps, Chrisoula Zegarra O, PA-C     Controlled Substance Prescriptions [place] Controlled Substance Registry consulted? Reviewed [place] controlled substance database. pt is on buprenorphine and has a significantly high score/risk of overdose. Narcotic pain medication would be inappropriate at this time.      Lurene Shadowhelps, Docie Abramovich O, [place] 10/14/18 1742

## 2018-10-14 NOTE — ED Triage Notes (Signed)
Pt stated that dysuria started yesterday, and could see blood in her urine and it was cloudy.  She was up with pain and vomiting last night.  Pain ranges from a 4-8.

## 2018-10-16 ENCOUNTER — Telehealth: Payer: Self-pay

## 2018-10-16 LAB — URINE CULTURE
MICRO NUMBER:: 391481
SPECIMEN QUALITY:: ADEQUATE

## 2018-10-16 MED ORDER — AMOXICILLIN 875 MG PO TABS: 875.0000 mg | ORAL_TABLET | Freq: Two times a day (BID) | ORAL | 0 refills | Status: AC

## 2018-10-16 NOTE — Telephone Encounter (Signed)
Left message for patient with UCX results and medication has been sent to pharmacy

## 2019-04-02 ENCOUNTER — Encounter (HOSPITAL_COMMUNITY): Payer: Self-pay | Admitting: General Practice

## 2019-04-02 ENCOUNTER — Other Ambulatory Visit: Payer: Self-pay | Admitting: Urology

## 2019-04-02 NOTE — Progress Notes (Signed)
Pt. Having a  Lithotripsy on Monday, Oct. 5, medications reviewed with patient, with understanding verbalized. Pt. Instructed to call office regarding her pain medication.

## 2019-04-04 NOTE — H&P (Signed)
Office Visit Report     04/01/2019   --------------------------------------------------------------------------------   Emily Silva  MRN: [id number]  DOB: Apr 30, 1991, 28 year old Female  SSN:    PRIMARY CARE:    REFERRING:    PROVIDER:  Marcine Matar, M.D.  TREATING:  Berniece Salines, M.D.  LOCATION:  [facility] [phone]     --------------------------------------------------------------------------------   CC: I have kidney stones.  HPI: Emily Silva is a 28 year-old female established patient who is here for renal calculi.  The problem is on the left side. She first stated noticing pain on approximately 03/19/2019. This is not her first kidney stone. She is currently having flank pain, back pain, nausea, vomiting, and chills. She denies having groin pain and fever. She has not caught a stone in her urine strainer since her symptoms began.   9.23.2020: She presents today reporting that last week she developed intense back/flank pain and she believes she has a kidney stone. She first took a UTI home test and had a positive result and self-medicated with cipro (2-3 doses a few days ago). She notes having had blood in the urine, increased urinary frequency, and nausea. Her pain is on average 6 or 7 out of 10 but occasionally spikes to around a 9 -- yesterday this pain caused her to vomit multiple times. Her last stone was in 2015.   9.29.2020: She returns today for follow-up on left ureteral stone. She states that she has taken the last of her oxycodone and her pain is "too much to handle." She is now unable to urinate and is very panicked about her pain. She reports being unable to work this last week due to her sx's.     ALLERGIES: No Allergies    MEDICATIONS: Tamsulosin Hcl 0.4 mg capsule 1 capsule PO Daily  Adderall 15 MG Oral Tablet Oral  Alprazolam Er 1 mg tablet, extended release 24 hr Oral  Aripiprazole 2 mg tablet  Buprenorphine Hcl 8 mg  tablet, sublingual  Dextroamphetamine-Amphetamine 20 mg tablet  Ondansetron Hcl 4 mg tablet 1 tablet SL Q 6 H prn nausea  Oxycodone Hcl 5 mg tablet 1 tablet PO q 4-6 hr prn pain  Oxycodone Hcl 5 mg tablet 1-2 tablet PO Q 6 H  Oxycodone Hcl 5 mg tablet 1 tablet PO Q 6 H PRN     GU PSH: Cysto Uretero Lithotripsy - 2014 Cystoscopy Insert Stent - 2014       PSH Notes: Cystoscopy With Ureteroscopy With Lithotripsy, Cystoscopy With Insertion Of Ureteral Stent Right, Oral Surgery Tooth Extraction   NON-GU PSH: Dental Surgery Procedure - 2014     GU PMH: Ureteral calculus, Left, CT today has 4 x 7 mm in left proximal ureter. She is currently symptomatic with pain, nausea/vomiting, chills, and urinary disturbances. Stone visible on scout film. - 03/26/2019, Calculus of left ureter, - 2015 Renal calculus, Renal calculus, bilateral - 2015 History of urolithiasis, Nephrolithiasis - 2014    NON-GU PMH: Encounter for general adult medical examination without abnormal findings, Encounter for preventive health examination - 2015 Anxiety, Anxiety (Symptom) - 2014    FAMILY HISTORY: Family Health Status Of Father - Alive - R[facility] In [facility] Family Health Status Of Mother - Alive 46 - Runs In Family Hematuria - Father nephrolithiasis - Runs In Family Prostate Cancer - Uncle   SOCIAL HISTORY: Marital Status: Single Current Smoking Status: Patient does not smoke anymore. Has not smoked since 03/04/2015. Smoked for 3 years.  Smoked less than 1/2 pack per day.   Tobacco Use Assessment Completed: Used Tobacco in last 30 days? Has never drank.  Drinks 3 caffeinated drinks per day. 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     Notes: Current every day smoker, Tobacco use, Caffeine Use, Alcohol Use, Marital History - Single, Currently In School   REVIEW OF SYSTEMS:    GU Review Female:   Patient reports burning /pain with urination, stream starts and stops, trouble starting your stream, and have  to strain to urinate. Patient denies frequent urination, hard to postpone urination, get up at night to urinate, leakage of urine, and being pregnant.  Gastrointestinal (Upper):   Patient reports nausea and vomiting. Patient denies indigestion/ heartburn.  Gastrointestinal (Lower):   Patient denies diarrhea and constipation.  Constitutional:   Patient reports night sweats. Patient denies fever, weight loss, and fatigue.  Skin:   Patient denies skin rash/ lesion and itching.  Eyes:   Patient denies blurred vision and double vision.  Ears/ Nose/ Throat:   Patient denies sore throat and sinus problems.  Hematologic/Lymphatic:   Patient denies swollen glands and easy bruising.  Cardiovascular:   Patient denies leg swelling and chest pains.  Respiratory:   Patient denies cough and shortness of breath.  Endocrine:   Patient denies excessive thirst.  Musculoskeletal:   Patient reports back pain. Patient denies joint pain.  Neurological:   Patient reports dizziness. Patient denies headaches.  Psychologic:   Patient denies depression and anxiety.   VITAL SIGNS:      04/01/2019 02:17 PM  BP 139/95 mmHg  Heart Rate 103 /min  Temperature 98.4 F / 36.8 C   MULTI-SYSTEM PHYSICAL EXAMINATION:    Constitutional: Well-nourished. No physical deformities. Normally developed. Good grooming.   Respiratory: Normal breath sounds. No labored breathing, no use of accessory muscles.   Cardiovascular: Regular rate and rhythm. No murmur, no gallop. Normal temperature, normal extremity pulses, no swelling, no varicosities.      PAST DATA REVIEWED:  Source Of History:  Patient  Records Review:   Previous Doctor Records, Previous Patient Records, POC Tool  X-Ray Review: C.T. Abdomen/Pelvis: Reviewed Films. Discussed With Patient.     PROCEDURES:         KUB - [id number]  A single view of the abdomen is obtained. Renal shadows are easily visualized bilaterally. There are no stones appreciated within the expected  location in either renal pelvis. There is a stone just proximal to L3 on the left in the expected location of the left ureter measuring approximately 5.5 mm in width. There are no additional calcifications along the expected location of either ureter bilaterally.  Gas pattern is grossly normal. No significant bony abnormalities.      Impression: The patient's stone is located in the left mid ureter and appears to be unchanged in location from 5 days prior.  Patient confirmed No Neulasta OnPro Device.           Urinalysis w/Scope Dipstick Dipstick Cont'd Micro  Color: Amber Bilirubin: Neg mg/dL WBC/hpf: 10 - 16/XWR20/hpf  Appearance: Slightly Cloudy Ketones: Neg mg/dL RBC/hpf: 0 - 2/hpf  Specific Gravity: 1.025 Blood: 1+ ery/uL Bacteria: Many (>50/hpf)  pH: 5.5 Protein: Trace mg/dL Cystals: NS (Not Seen)  Glucose: Neg mg/dL Urobilinogen: 0.2 mg/dL Casts: NS (Not Seen)    Nitrites: Neg Trichomonas: Not Present    Leukocyte Esterase: 1+ leu/uL Mucous: Present      Epithelial Cells: 10 - 20/hpf  Yeast: NS (Not Seen)      Sperm: Not Present         Ketoralac 60mg  - [id number], [id number] Qty: 60 Adm. By: Cristobal Goldmann  Unit: mg Lot No [id number]  Route: IM Exp. Date 08/31/2020  Freq: None Mfgr.:   Site: Left Buttock   ASSESSMENT:      ICD-10 Details  1 GU:   Ureteral calculus - N20.1    PLAN:            Medications Refill Meds: Oxycodone Hcl 5 mg tablet 1 tablet PO Q 6 H PRN   #20  0 Refill(s)            Orders Labs Urine Preg., CULTURE, URINE  X-Rays: KUB          Schedule Return Visit/Planned Activity: ASAP - Schedule Surgery             Note: Shockwave lithotripsy          Document Letter(s):  Created for Patient: Clinical Summary    We discussed management options including medical expulsion therapy, shockwave lithotripsy, and ureteroscopy. Ultimately, the patient has opted for shock wave lithotripsy. I discussed with the patient the procedure in detail as well as the risk  and benefits. The patient is aware that she may need additional procedures. She also is aware of the risks of hematoma and pain. We will try to get this patient's scheduled as soon as possible.         Notes:   1. Her stone has not moved since last CT and she is now experiencing much more severe pain. She will likely need this stone treated with ureteroscopy or shockwave lithotripsy -- recommending litho.   2. We will schedule her for lithotripsy ASAP for left ureteral calculus.   3. Prior to this procedure, we will manage pain with oxycodone. She instructed to avoid all anti-inflammatory medications 48 hours before.   4. Urine culture today -- will follow-up with results if needed.         Next Appointment:      Next Appointment: 04/03/2019 09:30 AM    Appointment Type: KUB    Location: [facility] [phone]    Provider: KUB KUB    Reason for Visit: KUB - bree      * Signed by Louis Meckel, M.D. on 04/01/19 at 5:02 PM (EDT)*     The information contained in this medical record document is considered private and confidential patient information. This information can only be used for the medical diagnosis and/or medical services that are being provided by the patient's selected caregivers. This information can only be distributed outside of the patient's care if the patient agrees and signs waivers of authorization for this information to be sent to an outside source or route.  Addendum:  I communicated with Dr. Kenton Kingfisher.  Urine culture was mixed growth and I sent cephalexin to start.

## 2019-04-07 ENCOUNTER — Encounter (HOSPITAL_COMMUNITY): Payer: Self-pay | Admitting: General Practice

## 2019-04-07 ENCOUNTER — Ambulatory Visit (HOSPITAL_COMMUNITY)
Admission: RE | Admit: 2019-04-07 | Discharge: 2019-04-07 | Disposition: A | Discharge status: Home / Self Care | Payer: 59 | Source: Other Acute Inpatient Hospital | Attending: Urology | Admitting: Urology

## 2019-04-07 ENCOUNTER — Ambulatory Visit (HOSPITAL_COMMUNITY): Payer: 59

## 2019-04-07 ENCOUNTER — Encounter (HOSPITAL_COMMUNITY)
Admission: RE | Disposition: A | Discharge status: Home / Self Care | Payer: Self-pay | Source: Other Acute Inpatient Hospital | Attending: Urology

## 2019-04-07 DIAGNOSIS — Z79899 Other long term (current) drug therapy: Secondary | ICD-10-CM | POA: Diagnosis not present

## 2019-04-07 DIAGNOSIS — Z87440 Personal history of urinary (tract) infections: Secondary | ICD-10-CM | POA: Insufficient documentation

## 2019-04-07 DIAGNOSIS — Z87891 Personal history of nicotine dependence: Secondary | ICD-10-CM | POA: Insufficient documentation

## 2019-04-07 DIAGNOSIS — F419 Anxiety disorder, unspecified: Secondary | ICD-10-CM | POA: Diagnosis not present

## 2019-04-07 DIAGNOSIS — N201 Calculus of ureter: Secondary | ICD-10-CM | POA: Diagnosis present

## 2019-04-07 DIAGNOSIS — Z87442 Personal history of urinary calculi: Secondary | ICD-10-CM | POA: Insufficient documentation

## 2019-04-07 HISTORY — PX: EXTRACORPOREAL SHOCK WAVE LITHOTRIPSY: SHX1557

## 2019-04-07 HISTORY — DX: Depression, unspecified: F32.A

## 2019-04-07 SURGERY — LITHOTRIPSY, ESWL
Anesthesia: LOCAL | Laterality: Left

## 2019-04-07 MED ORDER — CIPROFLOXACIN HCL 500 MG PO TABS
500.0000 mg | ORAL_TABLET | ORAL | Status: AC
  Administered 2019-04-07: 07:00:00 500 mg via ORAL
  Filled 2019-04-07: qty 1

## 2019-04-07 MED ORDER — SODIUM CHLORIDE 0.9 % IV SOLN
INTRAVENOUS | Status: DC
  Administered 2019-04-07: 07:00:00 via INTRAVENOUS

## 2019-04-07 MED ORDER — OXYCODONE HCL 5 MG PO TABS: 5.0000 mg | ORAL_TABLET | Freq: Four times a day (QID) | ORAL | 0 refills | Status: DC | PRN

## 2019-04-07 MED ORDER — DIAZEPAM 5 MG PO TABS
10.0000 mg | ORAL_TABLET | ORAL | Status: AC
  Administered 2019-04-07: 10 mg via ORAL
  Filled 2019-04-07: qty 2

## 2019-04-07 MED ORDER — DIPHENHYDRAMINE HCL 25 MG PO CAPS
25.0000 mg | ORAL_CAPSULE | ORAL | Status: AC
  Administered 2019-04-07: 25 mg via ORAL
  Filled 2019-04-07: qty 1

## 2019-04-07 NOTE — Op Note (Signed)
Left Proximal stone, 6 mm   Left ESWL   Findings: stone fragmented and faded well, she may need a staged procedure if she fails to pass the fragments.

## 2019-04-07 NOTE — Discharge Instructions (Signed)

## 2019-04-07 NOTE — Interval H&P Note (Signed)
History and Physical Interval Note:  04/07/2019 7:43 AM  Emily Silva  has presented today for surgery, with the diagnosis of LEFT MID URETERAL STONE.  The various methods of treatment have been discussed with the patient and family. After consideration of risks, benefits and other options for treatment, the patient has consented to  Procedure(s): EXTRACORPOREAL SHOCK WAVE LITHOTRIPSY (ESWL) (Left) as a surgical intervention.  The patient's history has been reviewed, patient examined, no change in status, stable for surgery.  I have reviewed the patient's chart and labs. She continues to have left flank pain and has not seen a stone pass. No fever, dysuria or gross hematuria. No stone passage. I discussed again with the patient the nature, potential benefits, risks and alternatives to left ESWL, including side effects of the proposed treatment, the likelihood of the patient achieving the goals of the procedure, and any potential problems that might occur during the procedure or recuperation. All questions answered. Patient elects to proceed. Questions were answered to the patient's satisfaction.     Festus Aloe

## 2019-04-08 ENCOUNTER — Encounter (HOSPITAL_COMMUNITY): Payer: Self-pay | Admitting: Urology

## 2019-05-11 ENCOUNTER — Other Ambulatory Visit: Payer: Self-pay

## 2019-05-11 ENCOUNTER — Encounter (HOSPITAL_COMMUNITY): Payer: Self-pay | Admitting: Emergency Medicine

## 2019-05-11 ENCOUNTER — Ambulatory Visit (HOSPITAL_COMMUNITY)
Admission: EM | Admit: 2019-05-11 | Discharge: 2019-05-11 | Disposition: A | Discharge status: Home / Self Care | Payer: 59 | Attending: Physician Assistant | Admitting: Physician Assistant

## 2019-05-11 DIAGNOSIS — J029 Acute pharyngitis, unspecified: Secondary | ICD-10-CM | POA: Diagnosis not present

## 2019-05-11 MED ORDER — AZITHROMYCIN 250 MG PO TABS: ORAL_TABLET | ORAL | 0 refills | Status: AC

## 2019-05-11 NOTE — ED Provider Notes (Signed)
[facility]    CSN: [id number] Arrival date & time: 05/11/19  1727      History   Chief Complaint Chief Complaint  Patient presents with  . Sore Throat    HPI Emily Silva is a 28 y.o. female.   The history is provided by the patient. No language interpreter was used.  Sore Throat This is a new problem. Episode onset: 10 days. The problem occurs constantly. The problem has been gradually worsening. Nothing aggravates the symptoms. Nothing relieves the symptoms. She has tried nothing for the symptoms. The treatment provided no relief.  Pt reports she continue to have a sore throat for 10 days   Past Medical History:  Diagnosis Date  . ADHD (attention deficit hyperactivity disorder)   . Anxiety disorder    subutex- for 4 years- psychiatrist prescribed.  . Depression   . Frequency of urination   . Hematuria   . History of kidney stones   . Renal disorder    Kidney stones  . Right ureteral stone     Patient Active Problem List   Diagnosis Date Noted  . ADHD (attention deficit hyperactivity disorder) 03/15/2015  . Anxiety disorder 03/15/2015  . Ureteral calculus, right 03/16/2013    Past Surgical History:  Procedure Laterality Date  . CYSTOSCOPY WITH URETEROSCOPY AND STENT PLACEMENT Right 03/17/2013   Procedure: CYSTOSCOPY WITH RIGHT URETEROSCOPY HOLMIUM LASER OF STONESER LITHOTRIPSY AND STENT PLACEMENT;  Surgeon: Garnett FarmMark C Ottelin, MD;  Location: [facility];  Service: Urology;  Laterality: Right;  . EXTRACORPOREAL SHOCK WAVE LITHOTRIPSY Left 04/07/2019   Procedure: EXTRACORPOREAL SHOCK WAVE LITHOTRIPSY (ESWL);  Surgeon: Jerilee FieldEskridge, Matthew, MD;  Location: [facility];  Service: Urology;  Laterality: Left;  . RHINOPLASTY    . WISDOM TOOTH EXTRACTION      OB History   No obstetric history on file.      Home Medications    Prior to Admission medications   Medication Sig Start Date End Date Taking? Authorizing Provider  ALPRAZolam Prudy Feeler(XANAX) 1  MG tablet Take 1 mg by mouth 4 (four) times daily as needed for anxiety.    Yes [provider]  amphetamine-dextroamphetamine (ADDERALL) 10 MG tablet Take 20 mg by mouth 3 (three) times daily.    Yes [provider]  buprenorphine (SUBUTEX) 8 MG SUBL SL tablet Place 8 mg under the tongue daily.  10/23/14  Yes [provider]  azithromycin (ZITHROMAX Z-PAK) 250 MG tablet Take 2 tablets (500 mg) on  Day 1,  followed by 1 tablet (250 mg) once daily on Days 2 through 5. 05/11/19 05/16/19  Elson AreasSofia, Leslie K, PA-C  Cetirizine HCl 10 MG CAPS Take 1 capsule (10 mg total) by mouth daily for 10 days. 08/01/18 08/11/18  Wieters, Hallie C, PA-C  HYDROcodone-homatropine (HYCODAN) 5-1.5 MG/5ML syrup Take 5 mLs by mouth at bedtime as needed for cough. 08/01/18   Wieters, Hallie C, PA-C  indomethacin (INDOCIN) 50 MG capsule Take 1 capsule (50 mg total) by mouth 2 (two) times daily with a meal. 08/01/18   Wieters, Junius CreamerHallie C, PA-C    Family History Family History  Problem Relation Age of Onset  . Cancer Father     Social History Social History   Tobacco Use  . Smoking status: Former Smoker    Types: Cigarettes    Quit date: 12/31/2013    Years since quitting: 5.3  . Smokeless tobacco: Never Used  . Tobacco comment: occasional smoker of cigarettes and e-cig since  2012  Substance Use Topics  . Alcohol use: No    Comment: occasional  . Drug use: No     Allergies   Nickel   Review of Systems Review of Systems  All other systems reviewed and are negative.    Physical Exam Triage Vital Signs ED Triage Vitals  Enc Vitals Group     BP 05/11/19 1746 109/74     Pulse Rate 05/11/19 1746 93     Resp 05/11/19 1746 18     Temp 05/11/19 1746 98.7 F (37.1 C)     Temp Source 05/11/19 1746 Oral     SpO2 05/11/19 1746 99 %     Weight --      Height --      Head Circumference --      Peak Flow --      Pain Score 05/11/19 1743 6     Pain Loc --      Pain Edu? --      Excl. in  GC? --    No data found.  Updated Vital Signs BP 109/74 (BP Location: Left Arm)   Pulse 93   Temp 98.7 F (37.1 C) (Oral)   Resp 18   LMP 04/20/2019 (Exact Date)   SpO2 99%   Visual Acuity Right Eye Distance:   Left Eye Distance:   Bilateral Distance:    Right Eye Near:   Left Eye Near:    Bilateral Near:     Physical Exam Vitals signs reviewed.  HENT:     Head: Normocephalic.     Right Ear: Tympanic membrane normal.     Left Ear: Tympanic membrane normal.     Mouth/Throat:     Mouth: Mucous membranes are moist.     Pharynx: Pharyngeal swelling and posterior oropharyngeal erythema present.  Neck:     Musculoskeletal: Normal range of motion.  Cardiovascular:     Rate and Rhythm: Normal rate.     Heart sounds: Normal heart sounds.  Skin:    General: Skin is warm.  Neurological:     Mental Status: She is alert.  Psychiatric:        Mood and Affect: Mood normal.      UC Treatments / Results  Labs (all labs ordered are listed, but only abnormal results are displayed) Labs Reviewed - No data to display  EKG   Radiology No results found.  Procedures Procedures (including critical care time)  Medications Ordered in UC Medications - No data to display  Initial Impression / Assessment and Plan / UC Course  I have reviewed the triage vital signs and the nursing notes.  Pertinent labs & imaging results that were available during my care of the patient were reviewed by me and considered in my medical decision making (see chart for details).     Pt ask for a prescription for zpack and cough medication.  Pt is on tessalon.  Final Clinical Impressions(s) / UC Diagnoses   Final diagnoses:  Pharyngitis, unspecified etiology     Discharge Instructions     Return if any problems.    ED Prescriptions    Medication Sig Dispense Auth. Provider   azithromycin (ZITHROMAX Z-PAK) 250 MG tablet Take 2 tablets (500 mg) on  Day 1,  followed by 1 tablet (250 mg)  once daily on Days 2 through 5. 6 each Elson Areas, PA-C     PMP is 880.  Pt has multiple rx's. On suboxone. Pt advised needs  to discuss narcotics with her provider  An After Visit Summary was printed and given to the patient. An After Visit Summary was printed and given to the patient.    Fransico Meadow, [place] 05/11/19 6767

## 2019-05-11 NOTE — Discharge Instructions (Signed)
Return if any problems.

## 2019-05-11 NOTE — ED Triage Notes (Addendum)
Pt states she yearly has allergy issues where she sees her PCP.  Pt had a telehealth visit but she is not feeling any better.   She states she works from home and hasn't been around anyone at all.  She was tested for Covid 3 weeks ago.  Pt is here for sore throat that started on Wednesday and she has developed a mild cough from the throat irritation in the last day.  Pt denies fever, loss of taste or smell, N, V, or D.

## 2020-07-18 ENCOUNTER — Encounter (HOSPITAL_COMMUNITY): Payer: Self-pay

## 2020-07-18 ENCOUNTER — Other Ambulatory Visit: Payer: Self-pay

## 2020-07-18 ENCOUNTER — Emergency Department (HOSPITAL_COMMUNITY)
Admission: EM | Admit: 2020-07-18 | Discharge: 2020-07-18 | Disposition: A | Discharge status: Home / Self Care | Payer: HRSA Program | Attending: Emergency Medicine | Admitting: Emergency Medicine

## 2020-07-18 DIAGNOSIS — Z87891 Personal history of nicotine dependence: Secondary | ICD-10-CM | POA: Diagnosis not present

## 2020-07-18 DIAGNOSIS — R059 Cough, unspecified: Secondary | ICD-10-CM | POA: Diagnosis present

## 2020-07-18 DIAGNOSIS — U071 COVID-19: Secondary | ICD-10-CM | POA: Diagnosis not present

## 2020-07-18 DIAGNOSIS — Z955 Presence of coronary angioplasty implant and graft: Secondary | ICD-10-CM | POA: Diagnosis not present

## 2020-07-18 LAB — POC SARS CORONAVIRUS 2 AG -  ED: SARS Coronavirus 2 Ag: POSITIVE — AB

## 2020-07-18 MED ORDER — HYDROCODONE-HOMATROPINE 5-1.5 MG/5ML PO SYRP: 5.0000 mL | ORAL_SOLUTION | Freq: Four times a day (QID) | ORAL | 0 refills | Status: DC | PRN

## 2020-07-18 MED ORDER — HYDROCODONE-HOMATROPINE 5-1.5 MG/5ML PO SYRP: 5.0000 mL | ORAL_SOLUTION | Freq: Four times a day (QID) | ORAL | 0 refills | Status: AC | PRN

## 2020-07-18 MED ORDER — BENZONATATE 100 MG PO CAPS: 100.0000 mg | ORAL_CAPSULE | Freq: Three times a day (TID) | ORAL | 0 refills | Status: AC

## 2020-07-18 MED ORDER — BENZONATATE 100 MG PO CAPS: 100.0000 mg | ORAL_CAPSULE | Freq: Three times a day (TID) | ORAL | 0 refills | Status: DC

## 2020-07-18 NOTE — ED Provider Notes (Signed)
[facility] EMERGENCY DEPARTMENT Provider Note   CSN: [id number] Arrival date & time: 07/18/20  1451     History Chief Complaint  Patient presents with  . Sore Throat    Emily Silva is a 30 y.o. female with PMH/o ADHD, depression, hematuria who presents for evaluation of 5 days of cough, sore throat, body aches, fevers, chest soreness. Cough is not productive. She states that her chest is sore whenever she coughs.  States that she has had fever Tylenol and ibuprofen.  She reports sore throat started about 2 days ago.  She has still been able tolerate p.o. and tolerate secretions but does report worsening pain with doing so.  She states that she has gotten 1 vaccine.  She has not got her second vaccine yet.  She denies any abdominal pain, nausea/vomiting.  The history is provided by the patient.       Past Medical History:  Diagnosis Date  . ADHD (attention deficit hyperactivity disorder)   . Anxiety disorder    subutex- for 4 years- psychiatrist prescribed.  . Depression   . Frequency of urination   . Hematuria   . History of kidney stones   . Renal disorder    Kidney stones  . Right ureteral stone     Patient Active Problem List   Diagnosis Date Noted  . ADHD (attention deficit hyperactivity disorder) 03/15/2015  . Anxiety disorder 03/15/2015  . Ureteral calculus, right 03/16/2013    Past Surgical History:  Procedure Laterality Date  . CYSTOSCOPY WITH URETEROSCOPY AND STENT PLACEMENT Right 03/17/2013   Procedure: CYSTOSCOPY WITH RIGHT URETEROSCOPY HOLMIUM LASER OF STONESER LITHOTRIPSY AND STENT PLACEMENT;  Surgeon: Garnett Farm, MD;  Location: [facility];  Service: Urology;  Laterality: Right;  . EXTRACORPOREAL SHOCK WAVE LITHOTRIPSY Left 04/07/2019   Procedure: EXTRACORPOREAL SHOCK WAVE LITHOTRIPSY (ESWL);  Surgeon: Jerilee Field, MD;  Location: [facility];  Service: Urology;  Laterality: Left;  . RHINOPLASTY    . WISDOM TOOTH  EXTRACTION       OB History   No obstetric history on file.     Family History  Problem Relation Age of Onset  . Cancer Father     Social History   Tobacco Use  . Smoking status: Former Smoker    Types: Cigarettes    Quit date: 12/31/2013    Years since quitting: 6.5  . Smokeless tobacco: Never Used  . Tobacco comment: occasional smoker of cigarettes and e-cig since 2012  Substance Use Topics  . Alcohol use: No    Comment: occasional  . Drug use: No    Home Medications Prior to Admission medications   Medication Sig Start Date End Date Taking? Authorizing Provider  ALPRAZolam Prudy Feeler) 1 MG tablet Take 1 mg by mouth 4 (four) times daily as needed for anxiety.     [provider]  amphetamine-dextroamphetamine (ADDERALL) 10 MG tablet Take 20 mg by mouth 3 (three) times daily.     [provider]  benzonatate (TESSALON) 100 MG capsule Take 1 capsule (100 mg total) by mouth every 8 (eight) hours. 07/18/20   Maxwell Caul, PA-C  buprenorphine (SUBUTEX) 8 MG SUBL SL tablet Place 8 mg under the tongue daily.  10/23/14   [provider]  Cetirizine HCl 10 MG CAPS Take 1 capsule (10 mg total) by mouth daily for 10 days. 08/01/18 08/11/18  Wieters, Hallie C, PA-C  HYDROcodone-homatropine (HYCODAN) 5-1.5 MG/5ML syrup Take 5 mLs by mouth  every 6 (six) hours as needed for cough. 07/18/20   Maxwell Caul, PA-C  indomethacin (INDOCIN) 50 MG capsule Take 1 capsule (50 mg total) by mouth 2 (two) times daily with a meal. 08/01/18   Wieters, Hallie C, PA-C    Allergies    Nickel  Review of Systems   Review of Systems  Constitutional: Positive for fatigue and fever.  HENT: Positive for sore throat. Negative for trouble swallowing.   Gastrointestinal: Negative for vomiting.  All other systems reviewed and are negative.   Physical Exam Updated Vital Signs BP 133/87 (BP Location: Right Arm)   Pulse 82   Temp 98.9 F (37.2 C) (Oral)   Resp 16   SpO2 100%    Physical Exam Vitals and nursing note reviewed.  Constitutional:      Appearance: She is well-developed and well-nourished.  HENT:     Head: Normocephalic and atraumatic.     Comments: Posterior erythematous.  No edema, exudates.  Uvula is midline.  Airway is patent, phonation is intact. Eyes:     General: No scleral icterus.       Right eye: No discharge.        Left eye: No discharge.     Extraocular Movements: EOM normal.     Conjunctiva/sclera: Conjunctivae normal.  Pulmonary:     Effort: Pulmonary effort is normal.     Comments: Lungs clear to auscultation bilaterally.  Symmetric chest rise.  No wheezing, rales, rhonchi. Skin:    General: Skin is warm and dry.  Neurological:     Mental Status: She is alert.  Psychiatric:        Mood and Affect: Mood and affect normal.        Speech: Speech normal.        Behavior: Behavior normal.     ED Results / Procedures / Treatments   Labs (all labs ordered are listed, but only abnormal results are displayed) Labs Reviewed  POC SARS CORONAVIRUS 2 AG -  ED - Abnormal; Notable for the following components:      Result Value   SARS Coronavirus 2 Ag POSITIVE (*)    All other components within normal limits    EKG None  Radiology No results found.  Procedures Procedures (including critical care time)  Medications Ordered in ED Medications - No data to display  ED Course  I have reviewed the triage vital signs and the nursing notes.  Pertinent labs & imaging results that were available during my care of the patient were reviewed by me and considered in my medical decision making (see chart for details).    MDM Rules/Calculators/A&P                          30 year old female presents for evaluation of sore throat, cough, generalized body symptoms.  She is also having fevers.  She has been vaccinated x1 for COVID.  On initial arrival, she is afebrile, toxic appearing.  Vital signs are stable.  On exam, posterior  oropharynx is slightly erythematous.  No edema, exudates.  Lungs clear to auscultation with no evidence of respiratory distress.  Concern for possible viral infectious process versus COVID-19.  COVID test ordered in triage.  Patient is COVID-positive.  Discussed results with patient.  This time, patient is well-appearing, hemodynamically stable with no evidence of respiratory distress.  She is stable for discharge at this time.  She states that she is having cough that  is keeping her up at night.  She has previously had cough syrup and Tessalon Perles.  We will give a short course for symptom control.  Patient referred to COVID treatment center. At this time, patient exhibits no emergent life-threatening condition that require further evaluation in ED. Patient had ample opportunity for questions and discussion. All patient's questions were answered with full understanding. Strict return precautions discussed. Patient expresses understanding and agreement to plan.   Emily Silva was evaluated in Emergency Department on 07/18/2020 for the symptoms described in the history of present illness. She was evaluated in the context of the global COVID-19 pandemic, which necessitated consideration that the patient might be at risk for infection with the SARS-CoV-2 virus that causes COVID-19. Institutional protocols and algorithms that pertain to the evaluation of patients at risk for COVID-19 are in a state of rapid change based on information released by regulatory bodies including the CDC and federal and state organizations. These policies and algorithms were followed during the patient's care in the ED.  Portions of this note were generated with Scientist, clinical (histocompatibility and immunogenetics). Dictation errors may occur despite best attempts at proofreading.  Final Clinical Impression(s) / ED Diagnoses Final diagnoses:  COVID-19    Rx / DC Orders ED Discharge Orders         Ordered    benzonatate (TESSALON) 100 MG capsule   Every 8 hours,   Status:  Discontinued        07/18/20 1741    HYDROcodone-homatropine (HYCODAN) 5-1.5 MG/5ML syrup  Every 6 hours PRN,   Status:  Discontinued        07/18/20 1741    Ambulatory referral for Covid Treatment        07/18/20 1742    benzonatate (TESSALON) 100 MG capsule  Every 8 hours        07/18/20 1742    HYDROcodone-homatropine (HYCODAN) 5-1.5 MG/5ML syrup  Every 6 hours PRN        07/18/20 1742           Maxwell Caul, PA-C 07/18/20 1814    Virgina Norfolk, DO 07/18/20 1819

## 2020-07-18 NOTE — Discharge Instructions (Signed)
You can take Tylenol or Ibuprofen as directed for pain. You can alternate Tylenol and Ibuprofen every 4 hours. If you take Tylenol at 1pm, then you can take Ibuprofen at 5pm. Then you can take Tylenol again at 9pm.   Make sure you are staying hydrated and drinking plenty of fluids.   Return to the Emergency Dept for any difficulty breathing, vomiting/inability to keep any food or liquid downs, chest pain or any other worsening or concerning symptoms.   

## 2020-07-18 NOTE — ED Triage Notes (Signed)
Patient complains of sore throat x 4 days with cough. States that she has only received only 1 vaccine. Patient alert and oriented, NAD

## 2020-07-18 NOTE — ED Notes (Addendum)
POC SARS COVID test "POSITIVE" reported to Dr. Jacqulyn Bath

## 2020-07-19 ENCOUNTER — Telehealth: Payer: Self-pay | Admitting: Infectious Diseases

## 2020-07-19 ENCOUNTER — Encounter: Payer: Self-pay | Admitting: Infectious Diseases

## 2020-07-19 NOTE — Telephone Encounter (Signed)
Called to discuss with patient about COVID-19 symptoms and the use of one of the available treatments for those with mild to moderate Covid symptoms and at a high risk of hospitalization.  Pt appears to qualify for outpatient treatment due to co-morbid conditions and/or a member of an at-risk group in accordance with the FDA Emergency Use Authorization.    Symptom onset: 07/14/20 Vaccinated: partially  Booster?  Immunocompromised? no Qualifiers: BMI > 30, incompletely vaccinated, depression  Unable to reach pt - LVM for her to call back. Last day for mab is 1/18  Rexene Alberts

## 2020-07-19 NOTE — Telephone Encounter (Signed)
Attempted to call patient back after she called the hotline requesting call back - went to voicemail.   Left information again for return call.   Emily Silva

## 2021-08-02 ENCOUNTER — Emergency Department (HOSPITAL_COMMUNITY)
Admission: EM | Admit: 2021-08-02 | Discharge: 2021-08-03 | Discharge status: Against Medical Advice | Payer: Managed Care, Other (non HMO) | Attending: Emergency Medicine | Admitting: Emergency Medicine

## 2021-08-02 ENCOUNTER — Emergency Department (HOSPITAL_COMMUNITY): Payer: Managed Care, Other (non HMO)

## 2021-08-02 DIAGNOSIS — R61 Generalized hyperhidrosis: Secondary | ICD-10-CM | POA: Diagnosis not present

## 2021-08-02 DIAGNOSIS — R6883 Chills (without fever): Secondary | ICD-10-CM | POA: Diagnosis not present

## 2021-08-02 DIAGNOSIS — M549 Dorsalgia, unspecified: Secondary | ICD-10-CM | POA: Diagnosis not present

## 2021-08-02 DIAGNOSIS — Z5321 Procedure and treatment not carried out due to patient leaving prior to being seen by health care provider: Secondary | ICD-10-CM | POA: Diagnosis not present

## 2021-08-02 DIAGNOSIS — R112 Nausea with vomiting, unspecified: Secondary | ICD-10-CM | POA: Insufficient documentation

## 2021-08-02 DIAGNOSIS — R109 Unspecified abdominal pain: Secondary | ICD-10-CM | POA: Diagnosis present

## 2021-08-02 LAB — URINALYSIS, ROUTINE W REFLEX MICROSCOPIC
Bilirubin Urine: NEGATIVE
Glucose, UA: NEGATIVE mg/dL
Ketones, ur: NEGATIVE mg/dL
Nitrite: NEGATIVE
Protein, ur: 30 mg/dL — AB
Specific Gravity, Urine: 1.02 (ref 1.005–1.030)
pH: 6.5 (ref 5.0–8.0)

## 2021-08-02 LAB — CBC
HCT: 41.4 % (ref 36.0–46.0)
Hemoglobin: 13.2 g/dL (ref 12.0–15.0)
MCH: 28.6 pg (ref 26.0–34.0)
MCHC: 31.9 g/dL (ref 30.0–36.0)
MCV: 89.8 fL (ref 80.0–100.0)
Platelets: 362 10*3/uL (ref 150–400)
RBC: 4.61 MIL/uL (ref 3.87–5.11)
RDW: 12.9 % (ref 11.5–15.5)
WBC: 11.4 10*3/uL — ABNORMAL HIGH (ref 4.0–10.5)
nRBC: 0 % (ref 0.0–0.2)

## 2021-08-02 LAB — COMPREHENSIVE METABOLIC PANEL
ALT: 26 U/L (ref 0–44)
AST: 24 U/L (ref 15–41)
Albumin: 4.1 g/dL (ref 3.5–5.0)
Alkaline Phosphatase: 79 U/L (ref 38–126)
Anion gap: 9 (ref 5–15)
BUN: 9 mg/dL (ref 6–20)
CO2: 25 mmol/L (ref 22–32)
Calcium: 9.6 mg/dL (ref 8.9–10.3)
Chloride: 105 mmol/L (ref 98–111)
Creatinine, Ser: 0.8 mg/dL (ref 0.44–1.00)
GFR, Estimated: >60 mL/min (ref >60)
Glucose, Bld: 111 mg/dL — ABNORMAL HIGH (ref 70–99)
Potassium: 3.9 mmol/L (ref 3.5–5.1)
Sodium: 139 mmol/L (ref 135–145)
Total Bilirubin: 0.5 mg/dL (ref 0.3–1.2)
Total Protein: 7.8 g/dL (ref 6.5–8.1)

## 2021-08-02 LAB — URINALYSIS, MICROSCOPIC (REFLEX): RBC / HPF: >50 RBC/hpf (ref 0–5)

## 2021-08-02 LAB — I-STAT BETA HCG BLOOD, ED (MC, WL, AP ONLY): I-stat hCG, quantitative: <5 m[IU]/mL (ref <5)

## 2021-08-02 LAB — LIPASE, BLOOD: Lipase: 28 U/L (ref 11–51)

## 2021-08-02 MED ORDER — IOHEXOL 300 MG/ML  SOLN
100.0000 mL | Freq: Once | INTRAMUSCULAR | Status: AC | PRN
  Administered 2021-08-02: 100 mL via INTRAVENOUS

## 2021-08-02 MED ORDER — OXYCODONE-ACETAMINOPHEN 5-325 MG PO TABS
1.0000 | ORAL_TABLET | Freq: Once | ORAL | Status: AC
  Administered 2021-08-02: 1 via ORAL
  Filled 2021-08-02: qty 1

## 2021-08-02 MED ORDER — ONDANSETRON 4 MG PO TBDP
4.0000 mg | ORAL_TABLET | Freq: Once | ORAL | Status: AC
Start: 2021-08-02 — End: 2021-08-02
  Administered 2021-08-02: 4 mg via ORAL
  Filled 2021-08-02: qty 1

## 2021-08-02 NOTE — ED Provider Triage Note (Signed)
Emergency Medicine Provider Triage Evaluation Note  Emily Silva , a 31 y.o. female  was evaluated in triage.  Pt complains of severe left flank pain, nausea, vomiting, and chills for 4 hours.  Patient had this pain going on for several days and has a history of kidney stones, she recently had pyelonephritis and was supposed to have an appointment tomorrow for urine sample from cath.  She states that her pain got severely worse today.  Review of Systems  Positive: As above Negative: Abdominal pain  Physical Exam  BP (!) 129/101 (BP Location: Left Arm)    Pulse 87    Temp 98.1 F (36.7 C) (Oral)    Resp 20    SpO2 97%  Gen:   Awake, no distress   Resp:  Normal effort  MSK:   Moves extremities without difficulty  Other:  Patient diaphoretic and vomiting in triage  Medical Decision Making  Medically screening exam initiated at 8:14 PM.  Appropriate orders placed.  Emily Silva was informed that the remainder of the evaluation will be completed by another provider, this initial triage assessment does not replace that evaluation, and the importance of remaining in the ED until their evaluation is complete.     Nona Gracey T, PA-C 08/02/21 2015

## 2021-08-02 NOTE — ED Triage Notes (Signed)
Pt c/o L sided back pain x3/4hrs. Hx kidney stones, pylo, states unable to make appt for catheter appt for clean urine sample tomorrow r/t pain. Pt endorses associated N/V, diaphoresis. Requesting CT scan

## 2021-08-03 NOTE — ED Notes (Signed)
Pt stated that she was leaving. Tried to convince pt to stay but she stated she was going to see her own doctor. EMT Mellody Dance removed IV.

## 2022-01-31 DEATH — deceased

## 2022-06-24 IMAGING — CT CT ABD-PELV W/ CM
2 of 4 series · 17 of 46 positions shown, 19 images · IV contrast (APPLIED)
Comparison: None.

CLINICAL DATA: Abdominal pain, acute, nonlocalized.  Kidney pain

EXAM:
CT ABDOMEN AND PELVIS WITH CONTRAST
TECHNIQUE: Multidetector CT imaging of the abdomen and pelvis was performed
using the standard protocol following bolus administration of
intravenous contrast.

[Series 3: abd/ pelvis 5.0 i30f 2 · axial · 0.98mm/px · z∈[+830,+1295]mm · 14 of 101 slices shown, 16 images]
[im 4/101  soft-tissue]
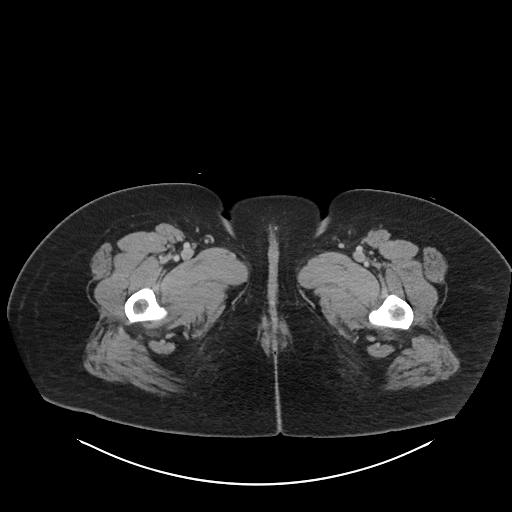
[im 4/101  bone]
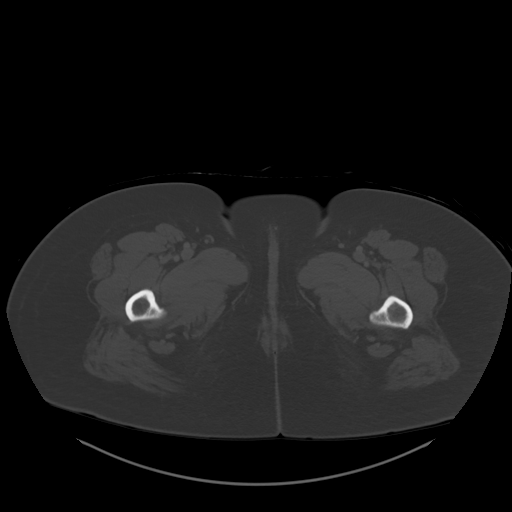
[im 12/101  soft-tissue]
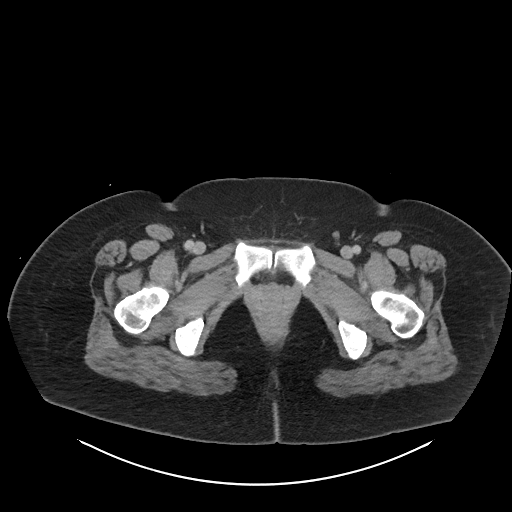
[im 19/101  soft-tissue]
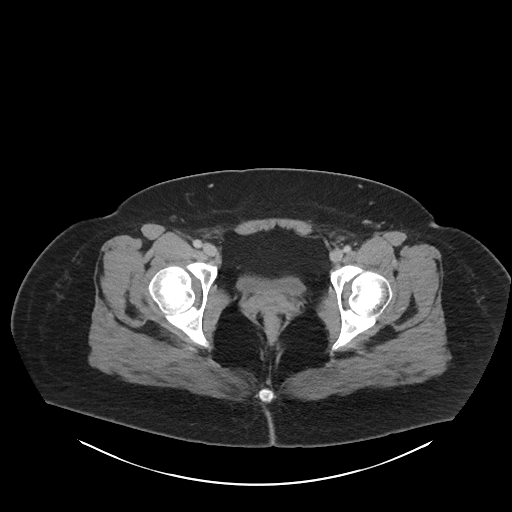
[im 26/101  soft-tissue]
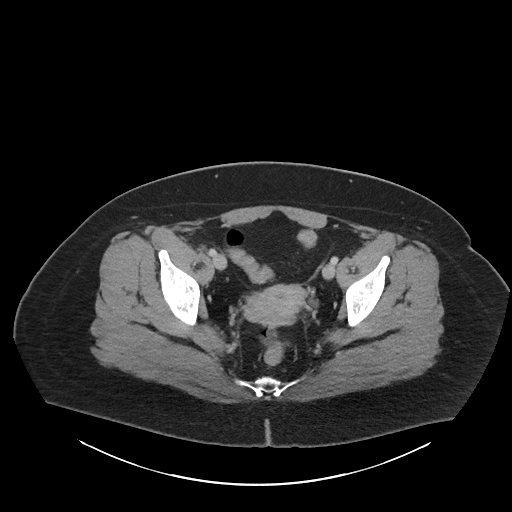
[im 34/101  soft-tissue]
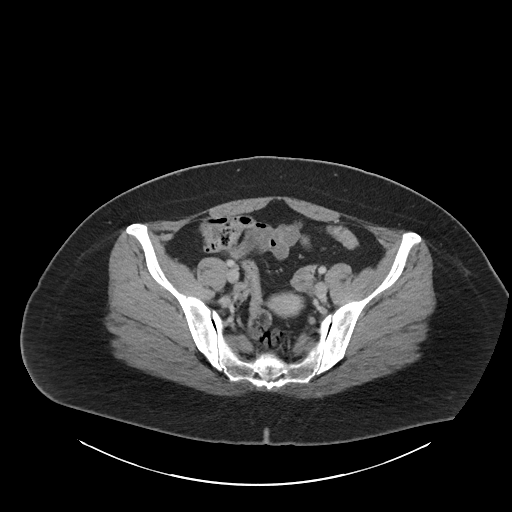
[im 41/101  soft-tissue]
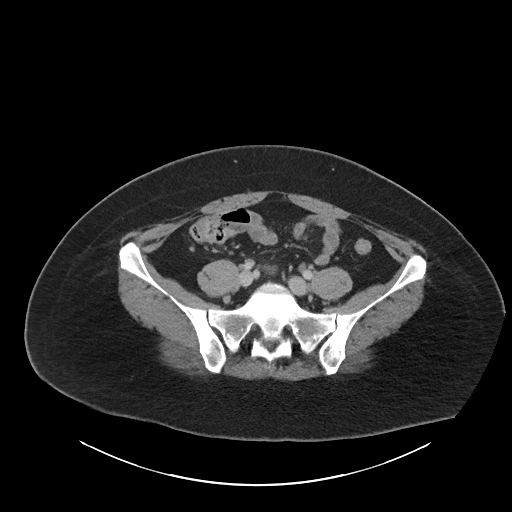
[im 49/101  soft-tissue]
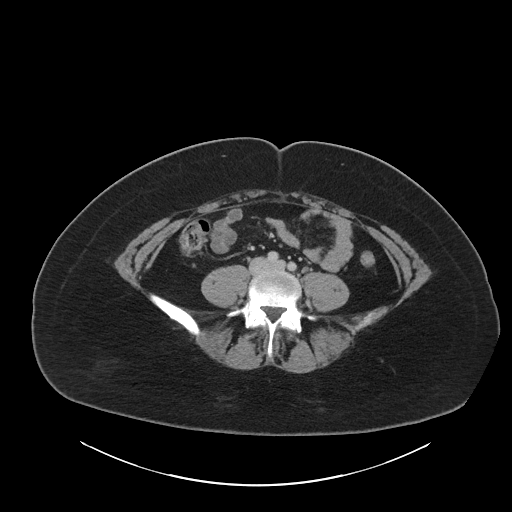
[im 52/101  soft-tissue]
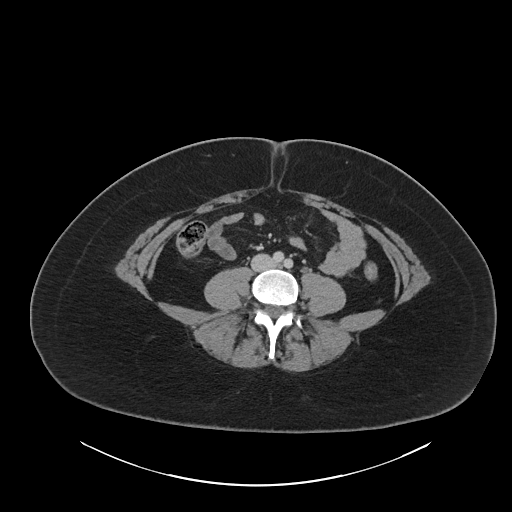
[im 60/101  soft-tissue]
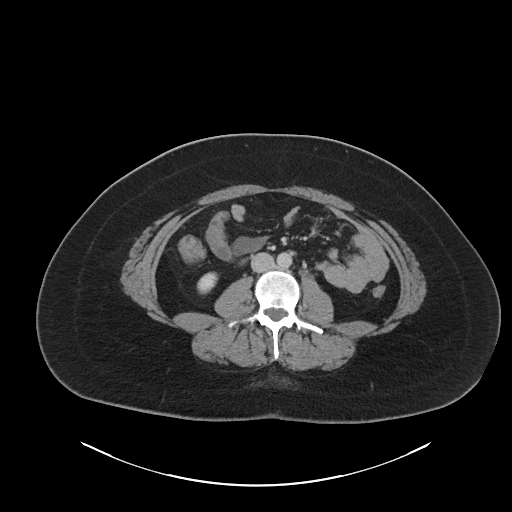
[im 60/101  bone]
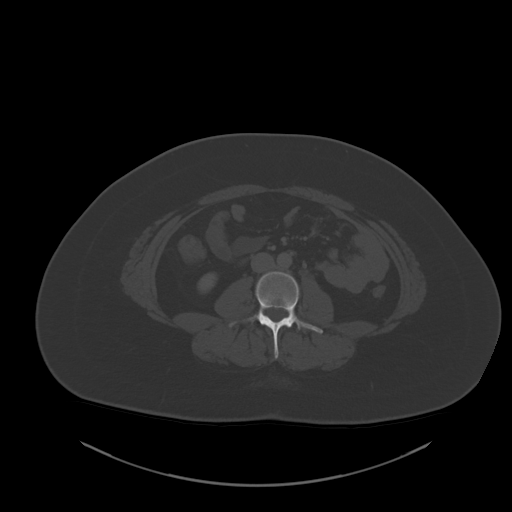
[im 67/101  soft-tissue]
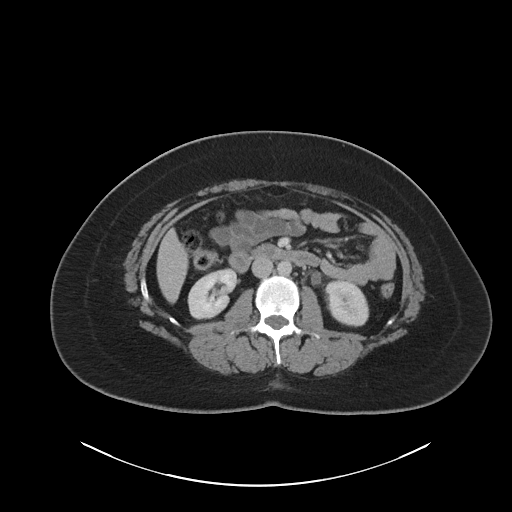
[im 75/101  soft-tissue]
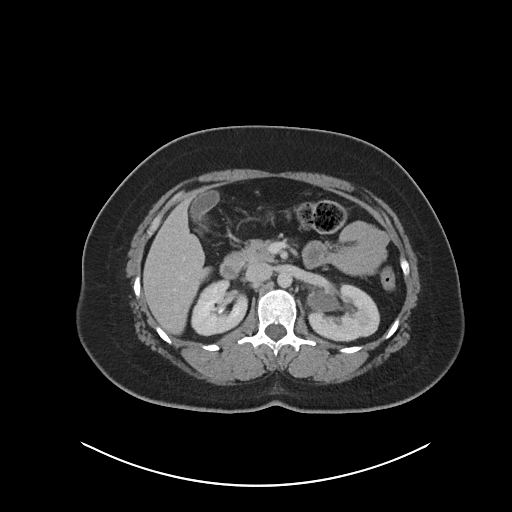
[im 82/101  soft-tissue]
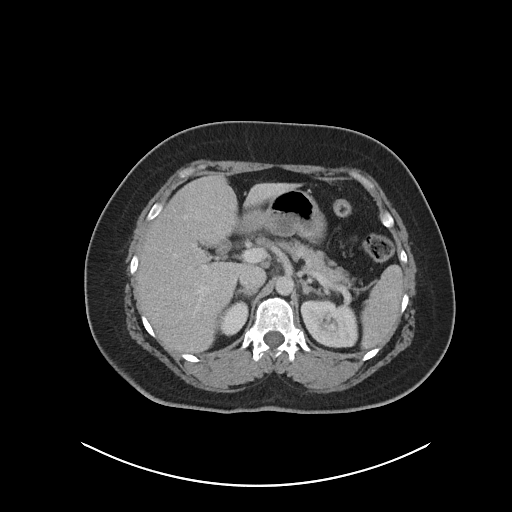
[im 89/101  soft-tissue]
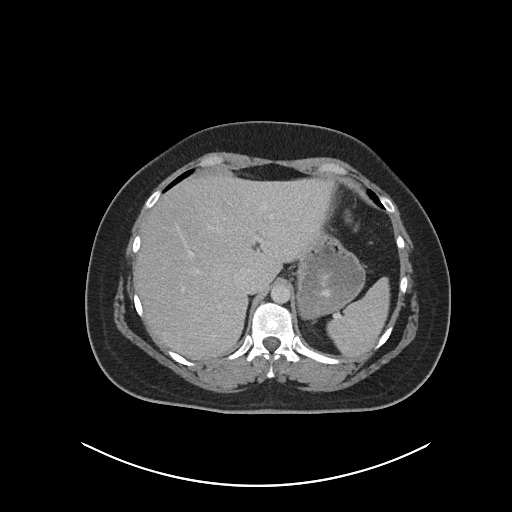
[im 97/101  soft-tissue]
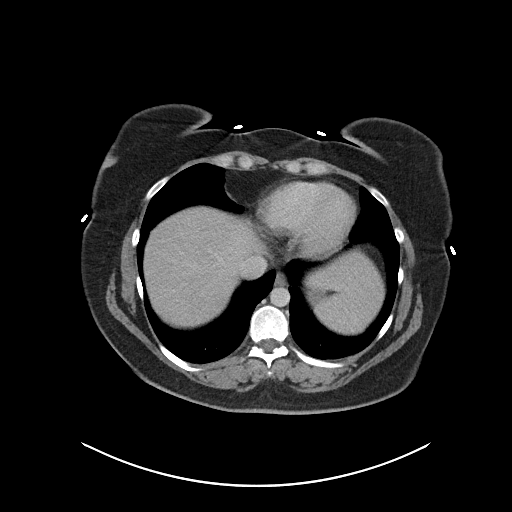

[Series 6: coronal soft tissue · coronal · 0.98mm/px · 3 of 102 slices shown]
[im 34/102  soft-tissue]
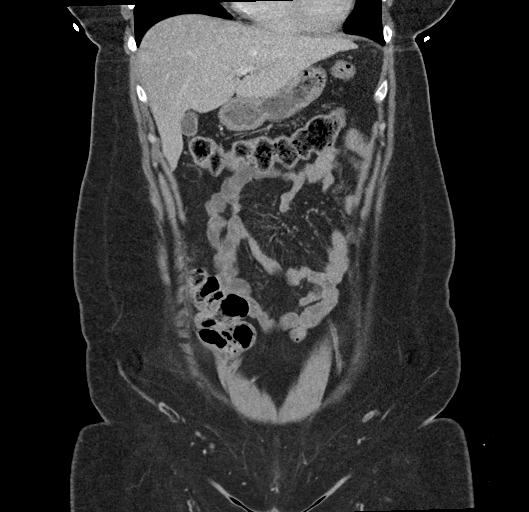
[im 45/102  soft-tissue]
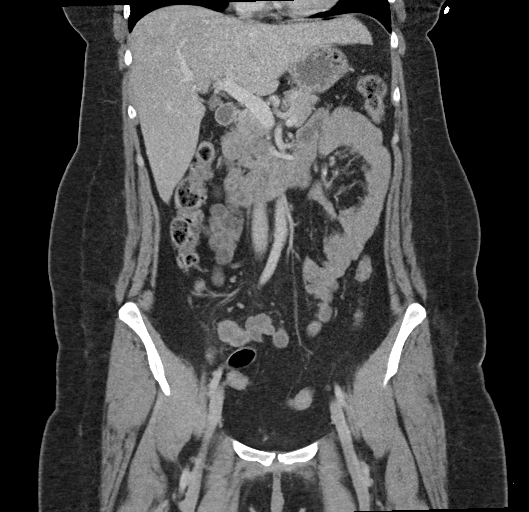
[im 57/102  soft-tissue]
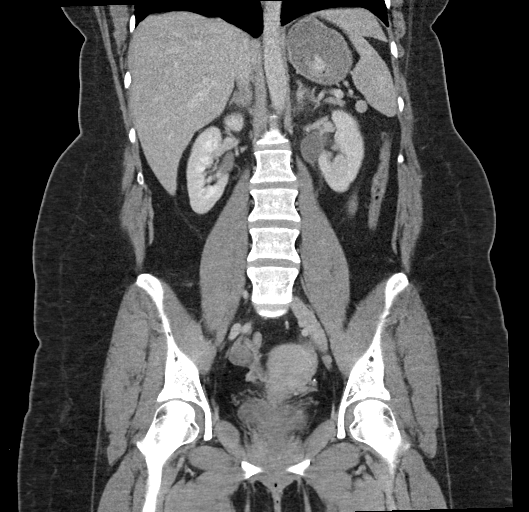

[17 of 46 positions shown; findings below may reference images not displayed]

RADIATION DOSE REDUCTION: This exam was performed according to the
departmental dose-optimization program which includes automated
exposure control, adjustment of the mA and/or kV according to
patient size and/or use of iterative reconstruction technique.

CONTRAST:  100mL OMNIPAQUE IOHEXOL 300 MG/ML  SOLN
FINDINGS: Lower chest: No acute abnormality.

Hepatobiliary: No focal liver abnormality. No gallstones,
gallbladder wall thickening, or pericholecystic fluid. No biliary
dilatation.

Pancreas: No focal lesion. Normal pancreatic contour. No surrounding
inflammatory changes. No main pancreatic ductal dilatation.

Spleen: Normal in size without focal abnormality. A splenule is
noted.

Adrenals/Urinary Tract:

No adrenal nodule bilaterally.

Bilateral kidneys enhance symmetrically.

There is a 4 mm left ureterovesicular junction stone with associated
proximal mild hydroureteronephrosis.

The urinary bladder is unremarkable.

Stomach/Bowel: Stomach is within normal limits. No evidence of bowel
wall thickening or dilatation. Appendix appears normal.

Vascular/Lymphatic: No abdominal aorta or iliac aneurysm. No
abdominal, pelvic, or inguinal lymphadenopathy.

Reproductive: Uterus and bilateral adnexa are unremarkable.

Other: No intraperitoneal free fluid. No intraperitoneal free gas.
No organized fluid collection.

Musculoskeletal:

No abdominal wall hernia or abnormality.

No suspicious lytic or blastic osseous lesions. No acute displaced
fracture.
IMPRESSION: Obstructive left ureterovesicular junction stone.
